# Patient Record
Sex: Male | Born: 1990 | Race: White | Hispanic: No | Marital: Married
Health system: Midwestern US, Academic
[De-identification: ages and names within clinical notes are randomized; demographics above are authoritative.]

---

## 2017-02-13 ENCOUNTER — Emergency Department
Admission: EM | Admit: 2017-02-13 | Discharge: 2017-02-14 | Disposition: A | Discharge status: Home / Self Care | Payer: BLUE CROSS/BLUE SHIELD | Attending: Emergency Medicine | Admitting: Emergency Medicine

## 2017-02-13 ENCOUNTER — Emergency Department: Payer: BLUE CROSS/BLUE SHIELD

## 2017-02-13 DIAGNOSIS — Y999 Unspecified external cause status: Secondary | ICD-10-CM | POA: Insufficient documentation

## 2017-02-13 DIAGNOSIS — Y9389 Activity, other specified: Secondary | ICD-10-CM | POA: Insufficient documentation

## 2017-02-13 DIAGNOSIS — Y929 Unspecified place or not applicable: Secondary | ICD-10-CM | POA: Insufficient documentation

## 2017-02-13 DIAGNOSIS — S39012A Strain of muscle, fascia and tendon of lower back, initial encounter: Secondary | ICD-10-CM | POA: Diagnosis not present

## 2017-02-13 DIAGNOSIS — X509XXA Other and unspecified overexertion or strenuous movements or postures, initial encounter: Secondary | ICD-10-CM | POA: Insufficient documentation

## 2017-02-13 DIAGNOSIS — M545 Low back pain: Secondary | ICD-10-CM | POA: Diagnosis present

## 2017-02-13 LAB — URINALYSIS, ROUTINE W REFLEX MICROSCOPIC
Bilirubin Urine: NEGATIVE
Glucose, UA: NEGATIVE mg/dL
HGB URINE DIPSTICK: NEGATIVE
Ketones, ur: NEGATIVE mg/dL
Leukocytes, UA: NEGATIVE
Nitrite: NEGATIVE
Protein, ur: NEGATIVE mg/dL
SPECIFIC GRAVITY, URINE: 1.018 (ref 1.005–1.030)
pH: 5 (ref 5.0–8.0)

## 2017-02-13 MED ORDER — CYCLOBENZAPRINE HCL 10 MG PO TABS
10.0000 mg | ORAL_TABLET | Freq: Once | ORAL | Status: DC
  Filled 2017-02-13: qty 1

## 2017-02-13 MED ORDER — KETOROLAC TROMETHAMINE 60 MG/2ML IM SOLN
30.0000 mg | Freq: Once | INTRAMUSCULAR | Status: AC
  Administered 2017-02-13: 30 mg via INTRAMUSCULAR
  Filled 2017-02-13: qty 2

## 2017-02-13 MED ORDER — NABUMETONE 750 MG PO TABS: 750.0000 mg | ORAL_TABLET | Freq: Two times a day (BID) | ORAL | 0 refills | Status: AC

## 2017-02-13 MED ORDER — CYCLOBENZAPRINE HCL 5 MG PO TABS: 5.0000 mg | ORAL_TABLET | Freq: Three times a day (TID) | ORAL | 0 refills | Status: AC | PRN

## 2017-02-13 NOTE — Discharge Instructions (Signed)
Your exam and labs are normal following your accident. Take the prescription meds as directed. Apply ice to reduce symptoms. Follow-up with [facility] as needed.

## 2017-02-13 NOTE — ED Triage Notes (Signed)
Patient reports he was sitting crossed legged on ground working on car tire.  He was on the last lug nut when he felt "a spasm" in his back.  Patient reports pain from lower back that radiates into testicles.

## 2017-02-14 NOTE — ED Provider Notes (Signed)
[facility] Emergency Department Provider Note ____________________________________________  Time seen: 2045  I have reviewed the triage vital signs and the nursing notes.  HISTORY  Chief Complaint  Back Pain  HPI Ricky Pearson is a 26 y.o. male Presents to the ED for evaluation of acute low back pain.Patient describes onset of low back pain after he had finished changing the tire on his car. He describes sitting on the ground with his legs crossed underneath him, attempting to turn the last Loc not, when he felt a sharp "spasm" in his back. He reports that he was unable to stand due to the sharp pain. He describes pain radius into his lower back bilaterally, and then wraps around to his testicles and groin bilaterally. He denies any dysuria, hematuria, or retention. He also denies any form swelling and scrotum or inguinal region. He denies any history of ongoing or chronic back pain. He denies any interventions prior to arrival. He presents now In by his wife, for evaluation of this acute low back pain and spasm.  No past medical history on file.  There are no active problems to display for this patient.  No past surgical history on file.  Prior to Admission medications   Medication Sig Start Date End Date Taking? Authorizing Provider  cyclobenzaprine (FLEXERIL) 5 MG tablet Take 1 tablet (5 mg total) by mouth 3 (three) times daily as needed for muscle spasms. 02/13/17   Jaselynn Tamas, Charlesetta Ivory, PA-C  nabumetone (RELAFEN) 750 MG tablet Take 1 tablet (750 mg total) by mouth 2 (two) times daily. 02/13/17   Salimata Christenson, Charlesetta Ivory, PA-C    Allergies Patient has no known allergies.  No family history on file.  Social History Social History  Substance Use Topics  . Smoking status: Not on file  . Smokeless tobacco: Not on file  . Alcohol use Not on file    Review of Systems  Constitutional: Negative for fever. Cardiovascular: Negative for chest  pain. Respiratory: Negative for shortness of breath. Gastrointestinal: Negative for abdominal pain, vomiting and diarrhea. Genitourinary: Negative for dysuria, hematuria, Or retention. Musculoskeletal: Positive for back pain. Skin: Negative for rash. Neurological: Negative for headaches, focal weakness or numbness. ____________________________________________  PHYSICAL EXAM:  VITAL SIGNS: ED Triage Vitals  Enc Vitals Group     BP 02/13/17 1910 (!) 143/54     Pulse Rate 02/13/17 1910 80     Resp 02/13/17 1910 18     Temp 02/13/17 1910 98.7 F (37.1 C)     Temp Source 02/13/17 1910 Oral     SpO2 02/13/17 1910 99 %     Weight 02/13/17 1909 195 lb (88.5 kg)     Height 02/13/17 1909 5\' 8"  (1.727 m)     Head Circumference --      Peak Flow --      Pain Score 02/13/17 1908 8     Pain Loc --      Pain Edu? --      Excl. in GC? --     Constitutional: Alert and oriented. Well appearing and in no distress. Head: Normocephalic and atraumatic. Eyes: Conjunctivae are normal. PERRL. Normal extraocular movements Cardiovascular: Normal rate, regular rhythm. Normal distal pulses. Respiratory: Normal respiratory effort. No wheezes/rales/rhonchi. Gastrointestinal: Soft and nontender. No distention. Musculoskeletal: Normal spinal alignment without midline tenderness, spasm, deformity, or step-off. Patient with some mild tenderness over the SI joints bilaterally. Normal transition from sit to stand without difficulty. Normal lumbar extension and decreased flexion  to the anterior thighs. Patient with a negative seated straight leg raise. Nontender with normal range of motion in all extremities.  Neurologic: Cranial nerves II through XII grossly intact. Normal LE DTRs bilaterally. Normal Foot eversion And toe dorsiflexion on exam.Normal speech and language. No gross focal neurologic deficits are appreciated. Skin:  Skin is warm, dry and intact. No rash  noted. ____________________________________________   LABS (pertinent positives/negatives) Labs Reviewed  URINALYSIS, ROUTINE W REFLEX MICROSCOPIC - Abnormal; Notable for the following:       Result Value   Color, Urine YELLOW (*)    APPearance CLEAR (*)    All other components within normal limits  ____________________________________________   RADIOLOGY  Lumbar Spine IMPRESSION: Negative. ____________________________________________  PROCEDURES  Toradol 30 mg IM ____________________________________________  INITIAL IMPRESSION / ASSESSMENT AND PLAN / ED COURSE  Patient with an acute lumbar sacral strain without radiologic evidence of fracture or dislocation. His exam is also benign at this time without any acute nerve neuromuscular deficit. He will be discharged with prescription for Flexeril and Relafen to dose as directed. He is advised to follow-up with [facility] for ongoing symptom management. A work note is provided for 2 shifts as requested. Return precautions are reviewed. ____________________________________________  FINAL CLINICAL IMPRESSION(S) / ED DIAGNOSES  Final diagnoses:  Strain of lumbar region, initial encounter      Lissa HoardMenshew, Danyela Posas V Bacon, PA-C 02/14/17 0026    Myrna BlazerSchaevitz, David Matthew, MD 02/14/17 [phone]

## 2018-10-18 IMAGING — CR DG LUMBAR SPINE COMPLETE 4+V
1 series · 4 of 4 positions shown · non-contrast
Comparison: None.

CLINICAL DATA: Low back pain

EXAM:
LUMBAR SPINE - COMPLETE 4+ VIEW

[Series 1: dg lumbar spine complete 4 +v · 0.14mm/px · 4 of 4 slices shown]
[im 1/4]
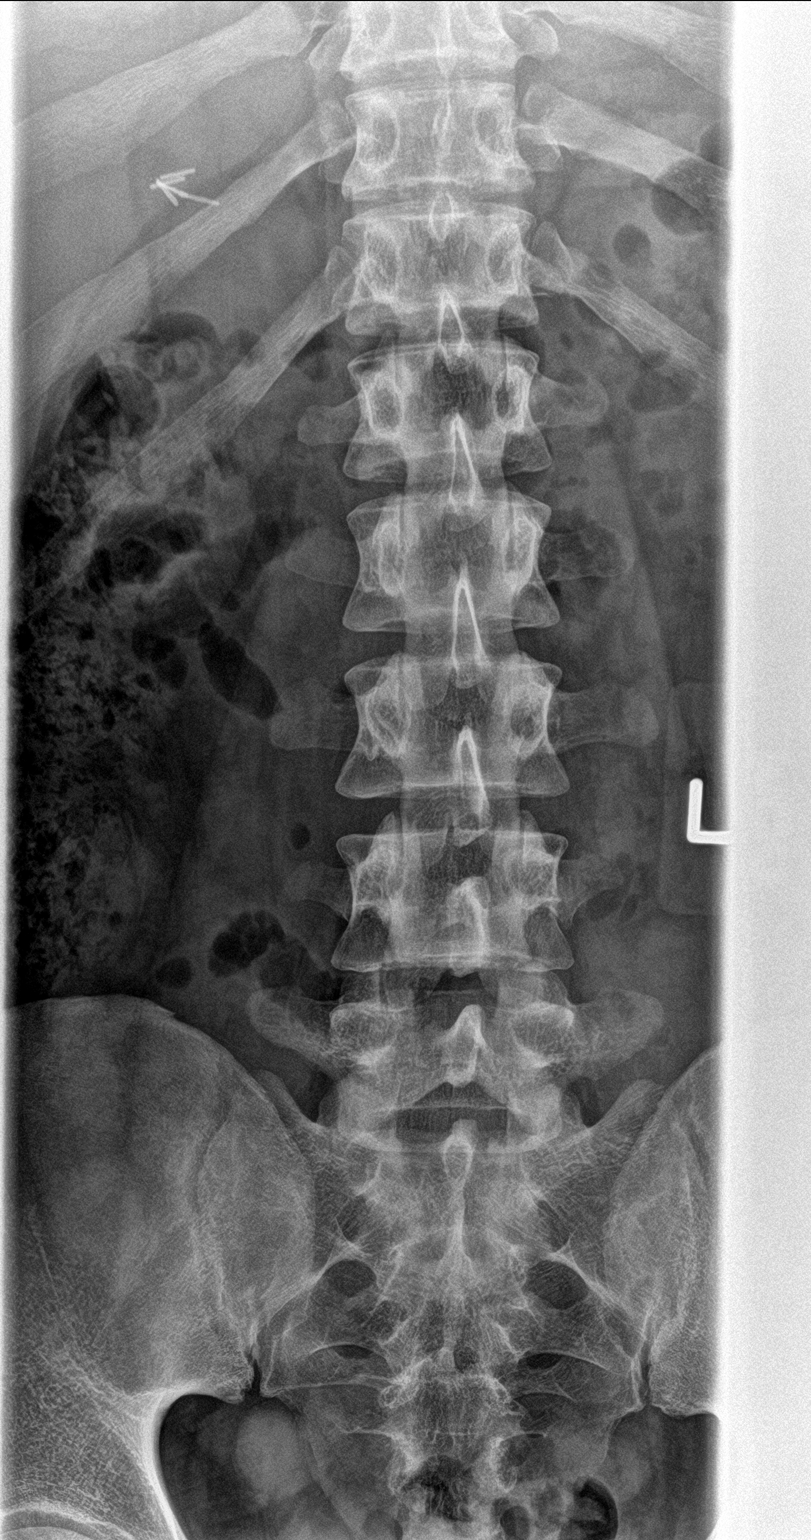
[im 2/4]
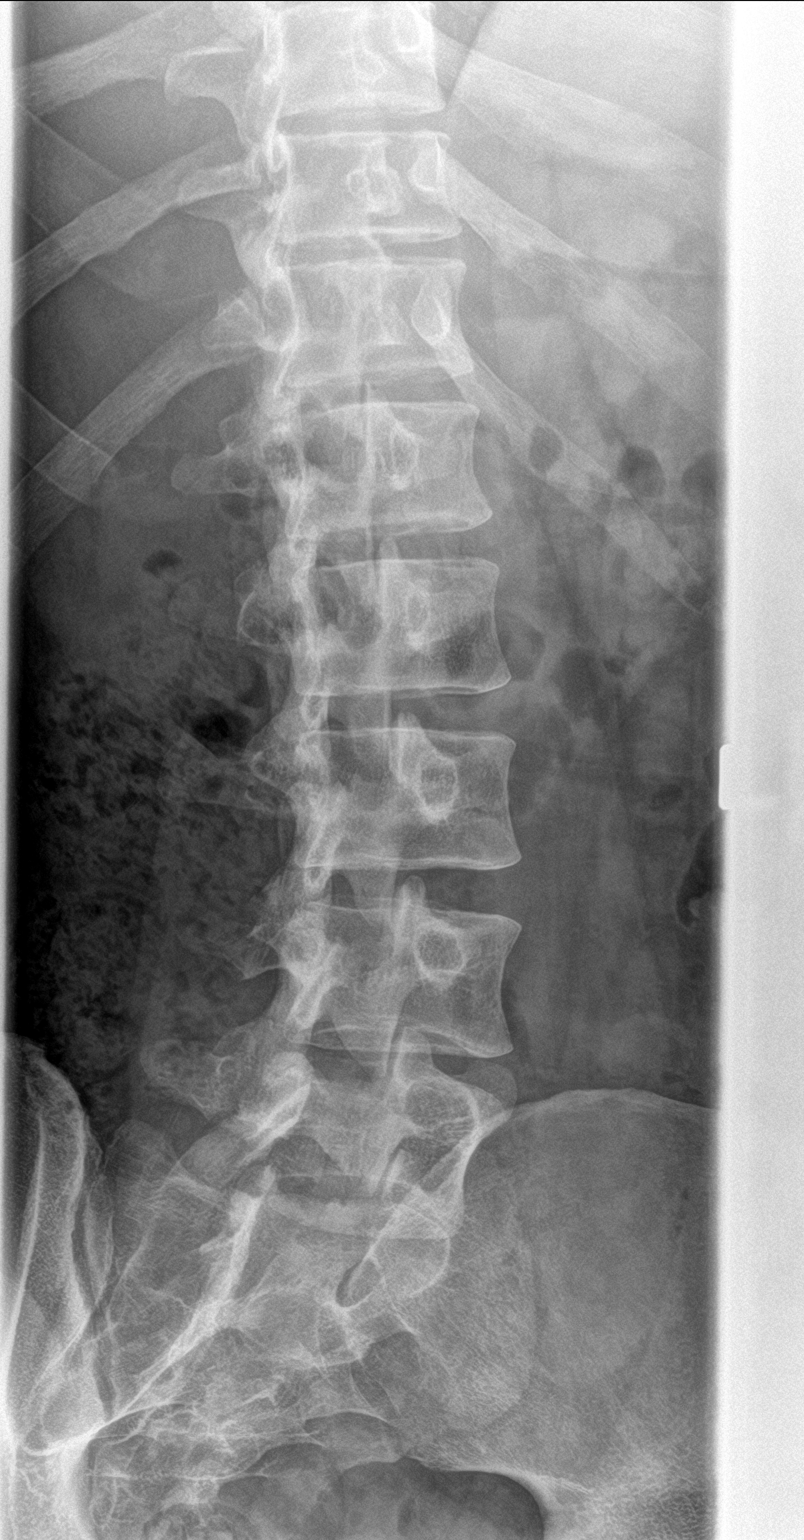
[im 3/4]
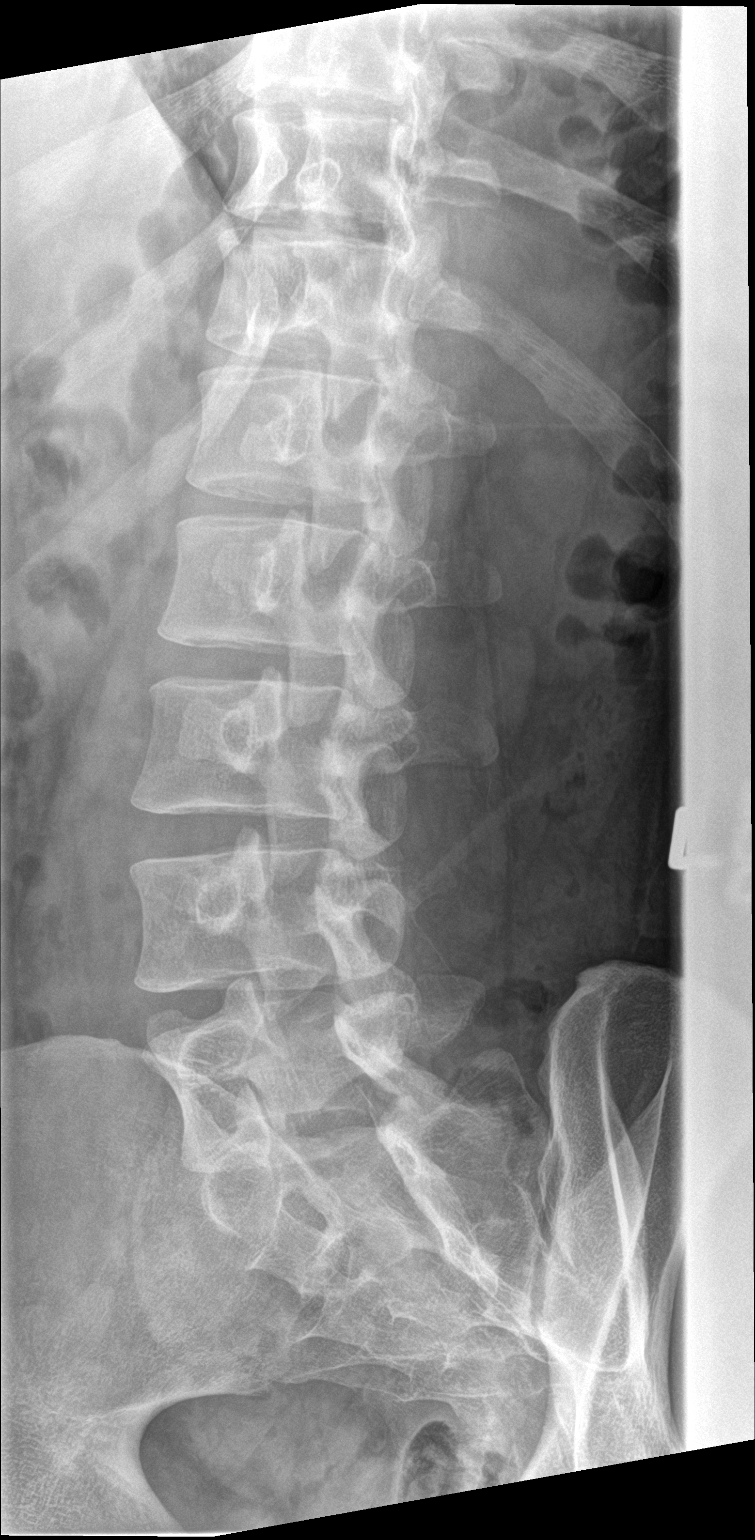
[im 4/4]
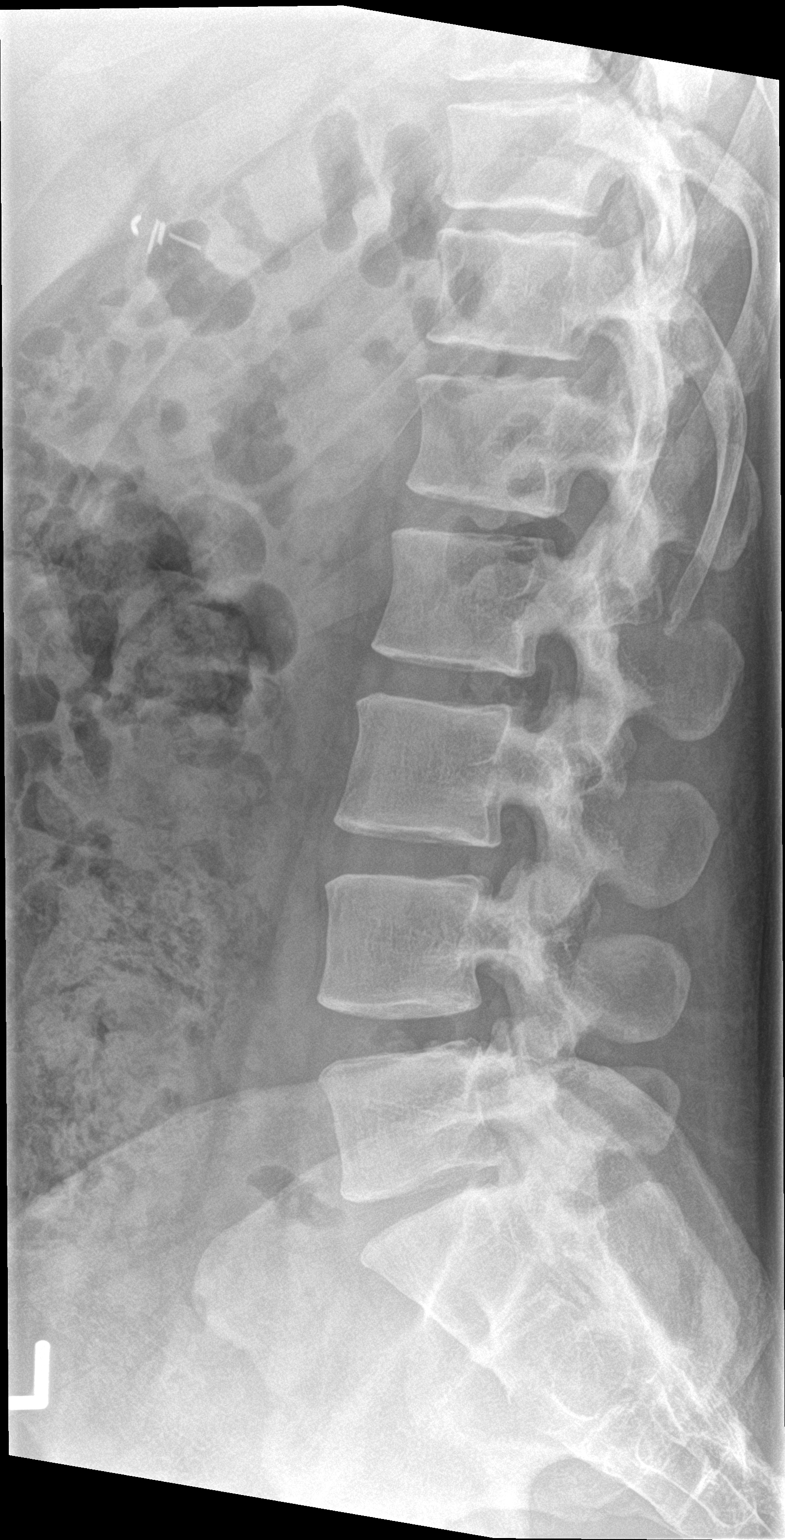

[4 of 4 positions shown; findings below may reference images not displayed]

FINDINGS: The lumbar vertebrae are normal in height. No fracture or other
acute bone abnormality is evident. There is no spondylolysis or
spondylolisthesis. There is good preservation of intervertebral disc
spaces. Sacroiliac joints appear unremarkable.
IMPRESSION: Negative.

## 2020-05-22 ENCOUNTER — Encounter: Admit: 2020-05-22 | Discharge: 2020-05-22 | Payer: BC Managed Care – PPO

## 2020-05-22 ENCOUNTER — Ambulatory Visit: Admit: 2020-05-22 | Discharge: 2020-05-22 | Payer: BC Managed Care – PPO

## 2020-05-22 DIAGNOSIS — N469 Male infertility, unspecified: Secondary | ICD-10-CM

## 2020-05-22 DIAGNOSIS — F329 Major depressive disorder, single episode, unspecified: Secondary | ICD-10-CM

## 2020-05-22 DIAGNOSIS — N390 Urinary tract infection, site not specified: Secondary | ICD-10-CM

## 2020-05-28 ENCOUNTER — Encounter: Admit: 2020-05-28 | Discharge: 2020-05-28 | Payer: BC Managed Care – PPO

## 2020-06-05 ENCOUNTER — Encounter: Admit: 2020-06-05 | Discharge: 2020-06-05 | Payer: BC Managed Care – PPO

## 2020-06-05 ENCOUNTER — Ambulatory Visit: Admit: 2020-06-05 | Discharge: 2020-06-06 | Payer: BC Managed Care – PPO

## 2020-06-05 DIAGNOSIS — R1033 Periumbilical pain: Principal | ICD-10-CM

## 2020-06-05 DIAGNOSIS — F329 Major depressive disorder, single episode, unspecified: Secondary | ICD-10-CM

## 2020-06-05 DIAGNOSIS — N390 Urinary tract infection, site not specified: Secondary | ICD-10-CM

## 2020-06-12 ENCOUNTER — Encounter: Admit: 2020-06-12 | Discharge: 2020-06-12 | Payer: BC Managed Care – PPO

## 2020-06-24 ENCOUNTER — Encounter: Admit: 2020-06-24 | Discharge: 2020-06-24 | Payer: BC Managed Care – PPO

## 2020-06-24 NOTE — Telephone Encounter
Contacted patient to let him know that Dr. Shon Hale recommended that he wait until after his wedding to proceed with his procedure. Patient agreed and said he'd call after his wedding to get rescheduled.

## 2022-03-13 ENCOUNTER — Inpatient Hospital Stay: Admit: 2022-03-13 | Payer: BC Managed Care – PPO

## 2022-03-13 ENCOUNTER — Encounter: Admit: 2022-03-13 | Discharge: 2022-03-13 | Payer: BC Managed Care – PPO

## 2022-03-13 ENCOUNTER — Emergency Department: Admit: 2022-03-13 | Discharge: 2022-03-13 | Payer: BC Managed Care – PPO

## 2022-03-13 DIAGNOSIS — K5792 Diverticulitis of intestine, part unspecified, without perforation or abscess without bleeding: Secondary | ICD-10-CM

## 2022-03-13 DIAGNOSIS — R1084 Generalized abdominal pain: Secondary | ICD-10-CM

## 2022-03-13 DIAGNOSIS — K5740 Diverticulitis of both small and large intestine with perforation and abscess without bleeding: Secondary | ICD-10-CM

## 2022-03-13 LAB — URINALYSIS MICROSCOPIC REFLEX TO CULTURE

## 2022-03-13 LAB — CHLAM/NG PCR URINE
CHLAMYDIA TRACH PCR: NEGATIVE
NEISSERIA GONORROEAE PCR: NEGATIVE

## 2022-03-13 LAB — COMPREHENSIVE METABOLIC PANEL
EGFR: >60 mL/min — ABNORMAL LOW (ref >60)
SODIUM: 136 MMOL/L — ABNORMAL LOW (ref 137–147)

## 2022-03-13 LAB — MAGNESIUM: MAGNESIUM: 2 mg/dL (ref 1.6–2.6)

## 2022-03-13 LAB — CBC AND DIFF
ABSOLUTE BASO COUNT: 0 K/UL (ref 0–0.20)
ABSOLUTE EOS COUNT: 0 K/UL (ref 0–0.45)
ABSOLUTE MONO COUNT: 0.3 K/UL (ref 0–0.80)
MDW (MONOCYTE DISTRIBUTION WIDTH): 26 — ABNORMAL HIGH (ref <20.7)
WBC COUNT: 9.4 K/UL (ref 4.5–11.0)

## 2022-03-13 LAB — POC LACTATE: LACTIC ACID POC: 2.7 MMOL/L — ABNORMAL HIGH (ref 0.5–2.0)

## 2022-03-13 LAB — COVID-19 (SARS-COV-2) PCR

## 2022-03-13 LAB — URINALYSIS DIPSTICK REFLEX TO CULTURE
LEUKOCYTES: NEGATIVE U/L (ref 7–56)
NITRITE: NEGATIVE MMOL/L — ABNORMAL HIGH (ref 21–30)
URINE ASCORBIC ACID, UA: NEGATIVE K/UL — ABNORMAL HIGH (ref 3–12)
URINE BILE: NEGATIVE g/dL — ABNORMAL HIGH (ref 3.5–5.0)
URINE BLOOD: NEGATIVE U/L — ABNORMAL HIGH (ref 25–110)
URINE KETONE: NEGATIVE mg/dL — ABNORMAL HIGH (ref 0.3–1.2)

## 2022-03-13 MED ORDER — LACTATED RINGERS IV SOLP
INTRAVENOUS | 0 refills | Status: AC
Start: 2022-03-13 — End: ?
  Administered 2022-03-13 – 2022-03-15 (×4): 1000.000 mL via INTRAVENOUS

## 2022-03-13 MED ORDER — ONDANSETRON HCL (PF) 4 MG/2 ML IJ SOLN
4 mg | Freq: Once | INTRAVENOUS | 0 refills | Status: CP
Start: 2022-03-13 — End: ?
  Administered 2022-03-13: 13:00:00 4 mg via INTRAVENOUS

## 2022-03-13 MED ORDER — ACETAMINOPHEN 1,000 MG/100 ML (10 MG/ML) IV SOLN
1000 mg | Freq: Once | INTRAVENOUS | 0 refills | Status: CP
Start: 2022-03-13 — End: ?
  Administered 2022-03-13: 22:00:00 1000 mg via INTRAVENOUS

## 2022-03-13 MED ORDER — ENOXAPARIN 40 MG/0.4 ML SC SYRG
40 mg | Freq: Every day | SUBCUTANEOUS | 0 refills | Status: AC
Start: 2022-03-13 — End: ?
  Administered 2022-03-15 – 2022-03-18 (×4): 40 mg via SUBCUTANEOUS

## 2022-03-13 MED ORDER — FENTANYL CITRATE (PF) 50 MCG/ML IJ SOLN
50 ug | INTRAVENOUS | 0 refills | Status: CP | PRN
Start: 2022-03-13 — End: ?
  Administered 2022-03-13 – 2022-03-14 (×3): 50 ug via INTRAVENOUS

## 2022-03-13 MED ORDER — ONDANSETRON HCL (PF) 4 MG/2 ML IJ SOLN
4 mg | INTRAVENOUS | 0 refills | Status: AC | PRN
Start: 2022-03-13 — End: ?
  Administered 2022-03-14 – 2022-03-15 (×3): 4 mg via INTRAVENOUS

## 2022-03-13 MED ORDER — LACTATED RINGERS IV SOLP
1000 mL | INTRAVENOUS | 0 refills | Status: CP
Start: 2022-03-13 — End: ?
  Administered 2022-03-13: 13:00:00 1000 mL via INTRAVENOUS

## 2022-03-13 MED ORDER — FENTANYL CITRATE (PF) 50 MCG/ML IJ SOLN
25-50 ug | INTRAVENOUS | 0 refills | Status: AC | PRN
Start: 2022-03-13 — End: ?
  Administered 2022-03-13 – 2022-03-14 (×11): 50 ug via INTRAVENOUS

## 2022-03-13 MED ORDER — LORAZEPAM 2 MG/ML IJ SOLN
1 mg | Freq: Once | INTRAVENOUS | 0 refills | Status: AC
Start: 2022-03-13 — End: ?

## 2022-03-13 MED ORDER — IOHEXOL 350 MG IODINE/ML IV SOLN
100 mL | Freq: Once | INTRAVENOUS | 0 refills | Status: CP
Start: 2022-03-13 — End: ?
  Administered 2022-03-13: 14:00:00 100 mL via INTRAVENOUS

## 2022-03-13 MED ORDER — ONDANSETRON 4 MG PO TBDI
4 mg | ORAL | 0 refills | Status: AC | PRN
Start: 2022-03-13 — End: ?
  Administered 2022-03-17: 4 mg via ORAL

## 2022-03-13 MED ORDER — NICOTINE 7 MG/24 HR TD PT24
1 | Freq: Every day | TRANSDERMAL | 0 refills | Status: AC
Start: 2022-03-13 — End: ?
  Administered 2022-03-13 – 2022-03-17 (×5): 1 via TRANSDERMAL

## 2022-03-13 MED ORDER — SODIUM CHLORIDE 0.9 % IJ SOLN
50 mL | Freq: Once | INTRAVENOUS | 0 refills | Status: CP
Start: 2022-03-13 — End: ?
  Administered 2022-03-13: 14:00:00 50 mL via INTRAVENOUS

## 2022-03-13 MED ORDER — PIPERACILLIN/TAZOBACTAM 4.5 G/100ML NS IVPB (MB+)
4.5 g | Freq: Once | INTRAVENOUS | 0 refills | Status: CP
Start: 2022-03-13 — End: ?
  Administered 2022-03-13 (×2): 4.5 g via INTRAVENOUS

## 2022-03-13 MED ORDER — LACTATED RINGERS IV SOLP
500 mL | INTRAVENOUS | 0 refills | Status: DC
Start: 2022-03-13 — End: 2022-03-13

## 2022-03-13 NOTE — ED Notes
31 y.o. male presents to the ED w/ c/o abdominal pain. Pt reports lower abdominal pain radiating to testicles along w/ nausea w/ out emesis, fever of 102F 2 days ago, intermittent lower back pain, and pain w/ urination. Pt reports symptoms were improving up until this morning ~0530 when he woke up w/ sharp lower abd. pain. Pt reports taking M.O.M 2 days ago for constipation and has had diarrhea w/out blood since. Pt denies any CP, SOA, HA, light-headedness, or dizziness. Pt A&Ox4 resting in bed, respirations even and non-labored. Skin is warm, dry, intact and appropriate for ethnicity. Pt hooked up to monitors. VSS. Bed is in lowest locked position, call light within reach. Belongings remain w/ pt.  Medical History:   Diagnosis Date    Depression     Urinary tract infection

## 2022-03-13 NOTE — ED Provider Notes
Rodney Padilla is a 31 y.o. male.    Chief Complaint:  Chief Complaint   Patient presents with   ? Abdominal pain     Lower quadrant abd pain x4 days with intermittent fever. Was constipated, but treated at home with milk of mag       History of Present Illness:  Rodney Padilla is a 31 y.o. male, who presents to the emergency department for abdominal pain. Patient endorses a four day history of suprapubic abdominal pain that radiates down to his testicles and through to his lower back. At first, he thought he was constipated so he took milk of magnesia two days ago with immediate relief of constipation. He has had watery diarrhea since. He also endorses associated dysuria, dark urine, 102 F fevers, but denies any hematuria, blood in stool, nausea, vomiting, or testicular swelling. Past abdominal surgeries include a cholecystectomy. Otherwise, the patient has no other acute medical concerns at this time.       History provided by:  Patient  Language interpreter used: No    Abdominal pain  Associated symptoms: dysuria and fever    Associated symptoms: no chest pain, no hematuria, no nausea, no shortness of breath, no sore throat and no vomiting        Review of Systems:  Review of Systems   Constitutional: Positive for fever.   HENT: Negative for sore throat.    Eyes: Negative for visual disturbance.   Respiratory: Negative for shortness of breath.    Cardiovascular: Negative for chest pain.   Gastrointestinal: Positive for abdominal pain. Negative for blood in stool, nausea and vomiting.   Genitourinary: Positive for dysuria and testicular pain. Negative for hematuria and scrotal swelling.   Musculoskeletal: Negative for back pain.   Skin: Negative for rash.   Neurological: Negative for headaches.       Allergies:  Patient has no known allergies.    Past Medical History:  Medical History:   Diagnosis Date   ? Depression    ? Urinary tract infection        Past Surgical History:  Surgical History:   Procedure Laterality Date   ? BROKEN BONES/ FRACTURES Left     broken leg   ? BROKEN BONES/ FRACTURES Left     hand   ? GALLBLADDER SURGERY     ? HX CHOLECYSTECTOMY         Pertinent medical/surgical history reviewed  Medical History:   Diagnosis Date   ? Depression    ? Urinary tract infection      Surgical History:   Procedure Laterality Date   ? BROKEN BONES/ FRACTURES Left     broken leg   ? BROKEN BONES/ FRACTURES Left     hand   ? GALLBLADDER SURGERY     ? HX CHOLECYSTECTOMY         Social History:  Social History     Tobacco Use   ? Smoking status: Every Day     Packs/day: 0.50     Types: Cigarettes   ? Smokeless tobacco: Never   Substance Use Topics   ? Alcohol use: Yes     Alcohol/week: 3.0 standard drinks of alcohol     Types: 3 Cans of beer per week   ? Drug use: Yes     Types: Marijuana     Social History     Substance and Sexual Activity   Drug Use Yes   ? Types: Marijuana  Family History:  Family History   Problem Relation Age of Onset   ? Hernia Mother    ? Pancreatitis Mother    ? Heart Attack Father    ? Diabetes Father        Vitals:  ED Vitals    Date and Time T BP P RR SPO2P SPO2 User   03/13/22 1230 -- 111/76 -- -- 100 93 % MB   03/13/22 1100 -- 120/75 -- -- 99 96 % MB   03/13/22 0930 -- 119/73 -- -- -- -- MB   03/13/22 0800 -- 124/69 -- -- 118 98 % MB   03/13/22 0626 37.8 ?C (100 ?F) 134/79 100 18 PER MINUTE -- 100 % CC          Physical Exam:  Physical Exam  Vitals and nursing note reviewed.   Constitutional:       Appearance: Normal appearance.   HENT:      Head: Normocephalic and atraumatic.   Eyes:      Extraocular Movements: Extraocular movements intact.      Conjunctiva/sclera: Conjunctivae normal.   Cardiovascular:      Rate and Rhythm: Regular rhythm. Tachycardia present.      Pulses: Normal pulses.      Heart sounds: Normal heart sounds.   Pulmonary:      Effort: Pulmonary effort is normal.      Breath sounds: Normal breath sounds.   Abdominal:      General: Bowel sounds are normal. There is no distension.      Palpations: Abdomen is soft.      Tenderness: There is abdominal tenderness in the suprapubic area.   Genitourinary:     Testes: Normal.      Comments: No masses, swelling, redness, tenderness.  Musculoskeletal:         General: Normal range of motion.      Cervical back: Neck supple.   Skin:     General: Skin is warm and dry.   Neurological:      Mental Status: He is oriented to person, place, and time. Mental status is at baseline.   Psychiatric:         Mood and Affect: Mood normal.         Behavior: Behavior normal.         Laboratory Results:  Labs Reviewed   CBC AND DIFF - Abnormal       Result Value Ref Range Status    White Blood Cells 9.4  4.5 - 11.0 K/UL Final    RBC 4.80  4.4 - 5.5 M/UL Final    Hemoglobin 15.4  13.5 - 16.5 GM/DL Final    Hematocrit 29.5  40 - 50 % Final    MCV 93.7  80 - 100 FL Final    MCH 32.1  26 - 34 PG Final    MCHC 34.2  32.0 - 36.0 G/DL Final    RDW 62.1  11 - 15 % Final    Platelet Count 186  150 - 400 K/UL Final    MPV 7.9  7 - 11 FL Final    Neutrophils 89 (*) 41 - 77 % Final    Lymphocytes 8 (*) 24 - 44 % Final    Monocytes 3 (*) 4 - 12 % Final    Eosinophils 0  0 - 5 % Final    Basophils 0  0 - 2 % Final    Absolute Neutrophil Count 8.35 (*) 1.8 -  7.0 K/UL Final    Absolute Lymph Count 0.70 (*) 1.0 - 4.8 K/UL Final    Absolute Monocyte Count 0.31  0 - 0.80 K/UL Final    Absolute Eosinophil Count 0.02  0 - 0.45 K/UL Final    Absolute Basophil Count 0.03  0 - 0.20 K/UL Final    MDW (Monocyte Distribution Width) 26.0 (*) <20.7 Final   COMPREHENSIVE METABOLIC PANEL - Abnormal    Sodium 136 (*) 137 - 147 MMOL/L Final    Potassium 3.8  3.5 - 5.1 MMOL/L Final    Chloride 99  98 - 110 MMOL/L Final    Glucose 97  70 - 100 MG/DL Final    Blood Urea Nitrogen 11  7 - 25 MG/DL Final    Creatinine 1.61  0.4 - 1.24 MG/DL Final    Calcium 9.7  8.5 - 10.6 MG/DL Final    Total Protein 7.8  6.0 - 8.0 G/DL Final    Total Bilirubin 2.1 (*) 0.3 - 1.2 MG/DL Final    Albumin 4.2  3.5 - 5.0 G/DL Final    Alk Phosphatase 111 (*) 25 - 110 U/L Final    AST (SGOT) 15  7 - 40 U/L Final    CO2 25  21 - 30 MMOL/L Final    ALT (SGPT) 22  7 - 56 U/L Final    Anion Gap 12  3 - 12 Final    eGFR >60  >60 mL/min Final   URINALYSIS DIPSTICK REFLEX TO CULTURE - Abnormal    Color,UA AMBER   Final    Turbidity,UA CLEAR  CLEAR-CLEAR Final    Specific Gravity-Urine >1.050 (*) 1.005 - 1.030 Final    pH,UA 5.0  5.0 - 8.0 Final    Protein,UA 2+ (*) NEG-NEG Final    Glucose,UA NEG  NEG-NEG Final    Ketones,UA NEG  NEG-NEG Final    Bilirubin,UA NEG  NEG-NEG Final    Blood,UA NEG  NEG-NEG Final    Urobilinogen,UA INCREASED (*) NORM-NORMAL Final    Nitrite,UA NEG  NEG-NEG Final    Leukocytes,UA NEG  NEG-NEG Final    Urine Ascorbic Acid, UA NEG  NEG-NEG Final   POC LACTATE - Abnormal    LACTIC ACID POC 2.7 (*) 0.5 - 2.0 MMOL/L Final   COVID-19 (SARS-COV-2) PCR    COVID-19 (SARS-CoV-2) PCR Source     Corrected    Value: FLOCKED SWAB  NASOPHARYNGEAL      COVID-19 (SARS-CoV-2) PCR NOT DETECTED  DN-NOT DETECTED Final   MAGNESIUM    Magnesium 2.0  1.6 - 2.6 mg/dL Final   URINALYSIS MICROSCOPIC REFLEX TO CULTURE    WBCs,UA 0-2  0 - 2 /HPF Final    RBCs,UA 0-2  0 - 3 /HPF Final    Comment,UA     Final    Value: Criteria for reflex to culture are WBC>10, Positive Nitrite, and/or >=+1   leukocytes. If quantity is not sufficient, an addendum will follow.      MucousUA 3+   Final    Squamous Epithelial Cells 0-2  0 - 5 Final   CHLAM/NG PCR URINE    Chlamydia Trachomatis Probe NEG  NEG-NEG Final    Neisseria Gonorroeae PCR NEG  NEG-NEG Final   UA GREY TOP TUBE   CLEAR TOP EXTRA URINE TUBE   POC LACTATE          Radiology Interpretation:    CT ABD/PELV W CONTRAST   Final Result  1.  Acute complicated sigmoid colon diverticulitis with associated perforation and peritonitis.    2.  Mild mural thickening of adjacent loops of small bowel in the lower abdomen, likely reactive enteritis.   3.  Mildly distended loops of small bowel, likely ileus.   4.  Trace ascites.         Janene Harvey, MD discussed these findings with Dr. Jean Rosenthal by telephone 03/13/2022 9:15 AM.          Approved by Clide Dales, MD on 03/13/2022 9:15 AM      By my electronic signature, I attest that I have personally reviewed the images for this examination and formulated the interpretations and opinions expressed in this report          Finalized by Rosilyn Mings, M.D. on 03/13/2022 9:36 AM. Dictated by Clide Dales, MD on 03/13/2022 8:41 AM.             EKG:      Medical Decision Making:    ED Course as of 03/13/22 2044   Fri Mar 13, 2022   [id number] Rodney Padilla is a 31 y.o. male with history as above presenting for complaint of abdominal pain, diarrhea, fever. Available records were reviewed. Vital signs and physical exam as documented above.     Differential diagnosis at this time includes but is not limited to appendicitis, nephrolithiasis, cystitis.     Laboratory evaluation with somewhat elevated total bilirubin of unknown etiology, CBC without marked leukocytosis, no elevated MDW.  Urinalysis noninfectious.  Patient did have slight lactic acidosis at 2.7.  He was given IV fluids, Zofran, fentanyl for pain.  A CT of the abdomen was conducted with finding of concern for perforated/complicated diverticulitis.  Given this, Zosyn was started, emergency general surgery was consulted for further recommendations.    Patient to be admitted to the emergency general surgery service, n.p.o., with no surgical intervention at this time.    Findings discussed with patient and the plan for admission. Patient understands and agrees with plan. Patient reassessed with appropriate vital signs and symptom control for transfer. Patient verbalized understanding. Admission team was notified and patient was accepted to inpatient service. All questions were answered prior to transfer to inpatient service. [JB]      ED Course User Index  [JB] Shea Evans, MD Complexity of Problems Addressed  Patient's active diagnoses as well as contributing pre-existing medical problems include:  Clinical Impression   Diverticulitis of both large and small intestine with perforation, unspecified bleeding status   Generalized abdominal pain     Evaluation performed for potential threat to life or bodily function during this visit given the initial differential diagnosis and clinical impression(s) as discussed previously in MDM/ED course.    Additional data reviewed:    ? History was obtained from an independent historian: Family  ? Prior non-ED notes reviewed: Not in addition to what is mentioned above  ? Independent interpretation of diagnostic tests was performed by me: CT: Marked stranding in the lower abdomen  ? Patient presentation/management was discussed with the following qualified health care professionals and/or other relevant professionals: Surgeon on Duty    Risk evaluation:    ? Diagnosis or treatment of patient condition impacted by social determinant of health: None  ? Tests Considered but not performed due to clinical scoring (if not mentioned in ED course, aside from what is implied by clinical scores listed):   ? Rationale regarding whether admission or escalation of care  considered if not performed (if not mentioned in ED course, aside from what is implied by clinical scores listed):       Facility Administered Meds:  Medications   fentaNYL citrate PF (SUBLIMAZE) injection 50 mcg (50 mcg Intravenous Given 03/13/22 1022)   ondansetron (ZOFRAN ODT) rapid dissolve tablet 4 mg (has no administration in time range)     Or   ondansetron (ZOFRAN) injection 4 mg (has no administration in time range)   enoxaparin (LOVENOX) syringe 40 mg (has no administration in time range)   lactated ringers infusion ( Intravenous Given - New Bag 03/13/22 1206)   fentaNYL citrate PF (SUBLIMAZE) injection 25-50 mcg (50 mcg Intravenous Given 03/13/22 1200)   LORazepam (ATIVAN) injection 1 mg (has no administration in time range)   lactated ringers infusion (0 mL Intravenous Infusion Stopped 03/13/22 1020)   ondansetron (ZOFRAN) injection 4 mg (4 mg Intravenous Given 03/13/22 0759)   iohexoL (OMNIPAQUE-350) 350 mg/mL injection 100 mL (100 mL Intravenous Given 03/13/22 0840)   sodium chloride PF 0.9% injection 50 mL (50 mL Intravenous Given 03/13/22 0839)   piperacillin/tazobactam (ZOSYN) 4.5 g in sodium chloride 0.9% (NS) 100 mL IVPB (MB+) (0 g Intravenous Infusion Stopped 03/13/22 1053)       Clinical Impression:  Clinical Impression   Diverticulitis of both large and small intestine with perforation, unspecified bleeding status   Generalized abdominal pain       Disposition/Follow up  ED Disposition     ED Disposition   Admit           No follow-up provider specified.    Medications:  Current Discharge Medication List          Procedure Notes:  Procedures       Attestation / Supervision:  Reginold Agent, am scribing for and in the presence of Stefani Dama, MD.    Dolores Hoose    I, Stefani Dama, MD, personally performed the services described in this documentation as scribed and it is both accurate and complete.    Stefani Dama, MD  Emergency Medicine, PGY-3  Reach[facility] by [facility]  03/13/2022

## 2022-03-14 MED ADMIN — SODIUM CHLORIDE 0.9 % IV PGBK (MB+) [95161]: 4.5 g | INTRAVENOUS | @ 19:00:00 | NDC 00338915930

## 2022-03-14 MED ADMIN — FENTANYL CITRATE (PF) 50 MCG/ML IJ SOLN [3037]: 50 ug | INTRAVENOUS | NDC 00409909412

## 2022-03-14 MED ADMIN — PIPERACILLIN-TAZOBACTAM 4.5 GRAM IV SOLR [80419]: 4.5 g | INTRAVENOUS | @ 19:00:00 | NDC 60505615900

## 2022-03-14 MED ADMIN — ACETAMINOPHEN 1,000 MG/100 ML (10 MG/ML) IV SOLN [305632]: 1000 mg | INTRAVENOUS | @ 10:00:00 | NDC 63323043441

## 2022-03-14 MED ADMIN — FENTANYL CITRATE (PF) 50 MCG/ML IJ SOLN [3037]: 50 ug | INTRAVENOUS | @ 15:00:00 | NDC 00409909412

## 2022-03-14 MED ADMIN — FENTANYL CITRATE (PF) 50 MCG/ML IJ SOLN [3037]: 50 ug | INTRAVENOUS | @ 17:00:00 | NDC 00409909412

## 2022-03-14 MED ADMIN — FENTANYL CITRATE (PF) 50 MCG/ML IJ SOLN [3037]: 50 ug | INTRAVENOUS | @ 19:00:00 | NDC 00409909412

## 2022-03-14 MED ADMIN — FENTANYL CITRATE (PF) 50 MCG/ML IJ SOLN [3037]: 50 ug | INTRAVENOUS | @ 22:00:00 | NDC 00409909412

## 2022-03-14 MED ADMIN — ACETAMINOPHEN 1,000 MG/100 ML (10 MG/ML) IV SOLN [305632]: 1000 mg | INTRAVENOUS | @ 19:00:00 | NDC 63323043441

## 2022-03-15 MED ADMIN — FENTANYL CITRATE (PF) 50 MCG/ML IJ SOLN [3037]: 50 ug | INTRAVENOUS | @ 21:00:00 | NDC 00409909412

## 2022-03-15 MED ADMIN — FENTANYL CITRATE (PF) 50 MCG/ML IJ SOLN [3037]: 50 ug | INTRAVENOUS | @ 19:00:00 | NDC 00409909412

## 2022-03-15 MED ADMIN — SODIUM CHLORIDE 0.9 % IV PGBK (MB+) [95161]: 4.5 g | INTRAVENOUS | @ 02:00:00 | NDC 00338915930

## 2022-03-15 MED ADMIN — SODIUM CHLORIDE 0.9 % IV PGBK (MB+) [95161]: 4.5 g | INTRAVENOUS | @ 15:00:00 | NDC 00338915930

## 2022-03-15 MED ADMIN — FENTANYL CITRATE (PF) 50 MCG/ML IJ SOLN [3037]: 50 ug | INTRAVENOUS | @ 15:00:00 | NDC 00409909412

## 2022-03-15 MED ADMIN — FENTANYL CITRATE (PF) 50 MCG/ML IJ SOLN [3037]: 50 ug | INTRAVENOUS | @ 12:00:00 | NDC 00409909412

## 2022-03-15 MED ADMIN — FENTANYL CITRATE (PF) 50 MCG/ML IJ SOLN [3037]: 50 ug | INTRAVENOUS | @ 02:00:00 | NDC 00409909412

## 2022-03-15 MED ADMIN — FENTANYL CITRATE (PF) 50 MCG/ML IJ SOLN [3037]: 50 ug | INTRAVENOUS | NDC 00409909412

## 2022-03-15 MED ADMIN — PIPERACILLIN-TAZOBACTAM 4.5 GRAM IV SOLR [80419]: 4.5 g | INTRAVENOUS | @ 20:00:00 | NDC 60505615900

## 2022-03-15 MED ADMIN — PIPERACILLIN-TAZOBACTAM 4.5 GRAM IV SOLR [80419]: 4.5 g | INTRAVENOUS | @ 02:00:00 | NDC 60505615900

## 2022-03-15 MED ADMIN — ACETAMINOPHEN 1,000 MG/100 ML (10 MG/ML) IV SOLN [305632]: 1000 mg | INTRAVENOUS | @ 02:00:00 | NDC 63323043441

## 2022-03-15 MED ADMIN — FENTANYL CITRATE (PF) 50 MCG/ML IJ SOLN [3037]: 50 ug | INTRAVENOUS | @ 04:00:00 | NDC 00409909412

## 2022-03-15 MED ADMIN — FENTANYL CITRATE (PF) 50 MCG/ML IJ SOLN [3037]: 50 ug | INTRAVENOUS | @ 10:00:00 | NDC 00409909412

## 2022-03-15 MED ADMIN — FENTANYL CITRATE (PF) 50 MCG/ML IJ SOLN [3037]: 50 ug | INTRAVENOUS | @ 07:00:00 | NDC 00409909412

## 2022-03-15 MED ADMIN — PIPERACILLIN-TAZOBACTAM 4.5 GRAM IV SOLR [80419]: 4.5 g | INTRAVENOUS | @ 08:00:00 | NDC 60505615900

## 2022-03-15 MED ADMIN — PIPERACILLIN-TAZOBACTAM 4.5 GRAM IV SOLR [80419]: 4.5 g | INTRAVENOUS | @ 15:00:00 | NDC 60505615900

## 2022-03-15 MED ADMIN — MAGNESIUM SULFATE IN WATER 4 GRAM/50 ML (8 %) IV PGBK [166563]: 4 g | INTRAVENOUS | @ 15:00:00 | Stop: 2022-03-15 | NDC 63323010701

## 2022-03-15 MED ADMIN — SODIUM CHLORIDE 0.9 % IV PGBK (MB+) [95161]: 4.5 g | INTRAVENOUS | @ 08:00:00 | NDC 00338915930

## 2022-03-15 MED ADMIN — ACETAMINOPHEN 1,000 MG/100 ML (10 MG/ML) IV SOLN [305632]: 1000 mg | INTRAVENOUS | @ 19:00:00 | NDC 63323043441

## 2022-03-15 MED ADMIN — ACETAMINOPHEN 1,000 MG/100 ML (10 MG/ML) IV SOLN [305632]: 1000 mg | INTRAVENOUS | @ 11:00:00 | NDC 63323043441

## 2022-03-15 MED ADMIN — SODIUM CHLORIDE 0.9 % IV PGBK (MB+) [95161]: 4.5 g | INTRAVENOUS | @ 20:00:00 | NDC 00338915930

## 2022-03-15 MED ADMIN — FENTANYL CITRATE (PF) 50 MCG/ML IJ SOLN [3037]: 50 ug | INTRAVENOUS | @ 17:00:00 | NDC 00409909412

## 2022-03-16 MED ADMIN — FENTANYL CITRATE (PF) 50 MCG/ML IJ SOLN [3037]: 50 ug | INTRAVENOUS | @ 17:00:00 | Stop: 2022-03-16 | NDC 00409909412

## 2022-03-16 MED ADMIN — FENTANYL CITRATE (PF) 50 MCG/ML IJ SOLN [3037]: 50 ug | INTRAVENOUS | @ 02:00:00 | NDC 00409909412

## 2022-03-16 MED ADMIN — PIPERACILLIN-TAZOBACTAM 4.5 GRAM IV SOLR [80419]: 4.5 g | INTRAVENOUS | @ 02:00:00 | NDC 60505615900

## 2022-03-16 MED ADMIN — FENTANYL CITRATE (PF) 50 MCG/ML IJ SOLN [3037]: 50 ug | INTRAVENOUS | @ 05:00:00 | NDC 00409909412

## 2022-03-16 MED ADMIN — SODIUM CHLORIDE 0.9 % IV PGBK (MB+) [95161]: 4.5 g | INTRAVENOUS | @ 08:00:00 | NDC 00338915930

## 2022-03-16 MED ADMIN — SODIUM CHLORIDE 0.9 % IV PGBK (MB+) [95161]: 4.5 g | INTRAVENOUS | @ 13:00:00 | NDC 00338915930

## 2022-03-16 MED ADMIN — FENTANYL CITRATE (PF) 50 MCG/ML IJ SOLN [3037]: 50 ug | INTRAVENOUS | @ 19:00:00 | Stop: 2022-03-16 | NDC 00409909412

## 2022-03-16 MED ADMIN — ACETAMINOPHEN 1,000 MG/100 ML (10 MG/ML) IV SOLN [305632]: 1000 mg | INTRAVENOUS | @ 04:00:00 | NDC 67457094010

## 2022-03-16 MED ADMIN — FENTANYL CITRATE (PF) 50 MCG/ML IJ SOLN [3037]: 50 ug | INTRAVENOUS | @ 21:00:00 | Stop: 2022-03-16 | NDC 00409909412

## 2022-03-16 MED ADMIN — MULTIVITAMIN PO TAB [37167]: 1 | ORAL | @ 21:00:00 | NDC 00536354710

## 2022-03-16 MED ADMIN — PIPERACILLIN-TAZOBACTAM 4.5 GRAM IV SOLR [80419]: 4.5 g | INTRAVENOUS | @ 20:00:00 | NDC 60505615900

## 2022-03-16 MED ADMIN — ACETAMINOPHEN 1,000 MG/100 ML (10 MG/ML) IV SOLN [305632]: 1000 mg | INTRAVENOUS | @ 19:00:00 | Stop: 2022-03-16 | NDC 63323043441

## 2022-03-16 MED ADMIN — PIPERACILLIN-TAZOBACTAM 4.5 GRAM IV SOLR [80419]: 4.5 g | INTRAVENOUS | @ 08:00:00 | NDC 60505615900

## 2022-03-16 MED ADMIN — GABAPENTIN 100 MG PO CAP [18309]: 200 mg | ORAL | @ 22:00:00 | NDC 00904666561

## 2022-03-16 MED ADMIN — FENTANYL CITRATE (PF) 50 MCG/ML IJ SOLN [3037]: 50 ug | INTRAVENOUS | @ 13:00:00 | Stop: 2022-03-16 | NDC 00409909412

## 2022-03-16 MED ADMIN — SODIUM CHLORIDE 0.9 % IV PGBK (MB+) [95161]: 4.5 g | INTRAVENOUS | @ 02:00:00 | NDC 00338915930

## 2022-03-16 MED ADMIN — ACETAMINOPHEN 1,000 MG/100 ML (10 MG/ML) IV SOLN [305632]: 1000 mg | INTRAVENOUS | @ 11:00:00 | Stop: 2022-03-16 | NDC 63323043441

## 2022-03-16 MED ADMIN — FENTANYL CITRATE (PF) 50 MCG/ML IJ SOLN [3037]: 50 ug | INTRAVENOUS | @ 11:00:00 | Stop: 2022-03-16 | NDC 00409909412

## 2022-03-16 MED ADMIN — FENTANYL CITRATE (PF) 50 MCG/ML IJ SOLN [3037]: 50 ug | INTRAVENOUS | @ 08:00:00 | Stop: 2022-03-16 | NDC 00409909412

## 2022-03-16 MED ADMIN — PIPERACILLIN-TAZOBACTAM 4.5 GRAM IV SOLR [80419]: 4.5 g | INTRAVENOUS | @ 13:00:00 | NDC 60505615900

## 2022-03-16 MED ADMIN — SODIUM CHLORIDE 0.9 % IV PGBK (MB+) [95161]: 4.5 g | INTRAVENOUS | @ 20:00:00 | NDC 00338915930

## 2022-03-17 ENCOUNTER — Encounter: Admit: 2022-03-17 | Discharge: 2022-03-17 | Payer: BC Managed Care – PPO

## 2022-03-17 MED ADMIN — PIPERACILLIN-TAZOBACTAM 4.5 GRAM IV SOLR [80419]: 4.5 g | INTRAVENOUS | @ 02:00:00 | NDC 60505615900

## 2022-03-17 MED ADMIN — SODIUM CHLORIDE 0.9 % IV PGBK (MB+) [95161]: 4.5 g | INTRAVENOUS | @ 19:00:00 | NDC 00338915930

## 2022-03-17 MED ADMIN — MULTIVITAMIN PO TAB [37167]: 1 | ORAL | @ 13:00:00 | NDC 00536354710

## 2022-03-17 MED ADMIN — TRAMADOL 50 MG PO TAB [14632]: 100 mg | ORAL | @ 17:00:00 | NDC 65162062750

## 2022-03-17 MED ADMIN — TRAMADOL 50 MG PO TAB [14632]: 100 mg | ORAL | @ 22:00:00 | NDC 65162062750

## 2022-03-17 MED ADMIN — SODIUM CHLORIDE 0.9 % IV PGBK (MB+) [95161]: 4.5 g | INTRAVENOUS | @ 08:00:00 | NDC 00338915930

## 2022-03-17 MED ADMIN — SODIUM CHLORIDE 0.9 % IV PGBK (MB+) [95161]: 4.5 g | INTRAVENOUS | @ 13:00:00 | NDC 00338915930

## 2022-03-17 MED ADMIN — GABAPENTIN 100 MG PO CAP [18309]: 200 mg | ORAL | @ 19:00:00 | NDC 00904666561

## 2022-03-17 MED ADMIN — PIPERACILLIN-TAZOBACTAM 4.5 GRAM IV SOLR [80419]: 4.5 g | INTRAVENOUS | @ 19:00:00 | NDC 60505615900

## 2022-03-17 MED ADMIN — TRAMADOL 50 MG PO TAB [14632]: 100 mg | ORAL | @ 05:00:00 | NDC 65162062750

## 2022-03-17 MED ADMIN — PIPERACILLIN-TAZOBACTAM 4.5 GRAM IV SOLR [80419]: 4.5 g | INTRAVENOUS | @ 13:00:00 | NDC 60505615900

## 2022-03-17 MED ADMIN — ACETAMINOPHEN 325 MG PO TAB [101]: 650 mg | ORAL | @ 02:00:00 | NDC 00904677361

## 2022-03-17 MED ADMIN — GABAPENTIN 100 MG PO CAP [18309]: 200 mg | ORAL | @ 11:00:00 | NDC 00904666561

## 2022-03-17 MED ADMIN — TRAMADOL 50 MG PO TAB [14632]: 100 mg | ORAL | @ 11:00:00 | NDC 65162062750

## 2022-03-17 MED ADMIN — TRAMADOL 50 MG PO TAB [14632]: 100 mg | ORAL | NDC 65162062750

## 2022-03-17 MED ADMIN — SODIUM CHLORIDE 0.9 % IV PGBK (MB+) [95161]: 4.5 g | INTRAVENOUS | @ 02:00:00 | NDC 00338915930

## 2022-03-17 MED ADMIN — PIPERACILLIN-TAZOBACTAM 4.5 GRAM IV SOLR [80419]: 4.5 g | INTRAVENOUS | @ 08:00:00 | NDC 60505615900

## 2022-03-17 MED ADMIN — GABAPENTIN 100 MG PO CAP [18309]: 200 mg | ORAL | @ 02:00:00 | NDC 00904666561

## 2022-03-18 ENCOUNTER — Encounter: Admit: 2022-03-18 | Discharge: 2022-03-18 | Payer: BC Managed Care – PPO

## 2022-03-18 MED ADMIN — PIPERACILLIN-TAZOBACTAM 4.5 GRAM IV SOLR [80419]: 4.5 g | INTRAVENOUS | @ 02:00:00 | NDC 60505615900

## 2022-03-18 MED ADMIN — PIPERACILLIN-TAZOBACTAM 4.5 GRAM IV SOLR [80419]: 4.5 g | INTRAVENOUS | @ 09:00:00 | Stop: 2022-03-18 | NDC 60505615900

## 2022-03-18 MED ADMIN — SODIUM CHLORIDE 0.9 % IV PGBK (MB+) [95161]: 4.5 g | INTRAVENOUS | @ 02:00:00 | NDC 00338915930

## 2022-03-18 MED ADMIN — SODIUM CHLORIDE 0.9 % IV PGBK (MB+) [95161]: 4.5 g | INTRAVENOUS | @ 13:00:00 | Stop: 2022-03-18 | NDC 00338915930

## 2022-03-18 MED ADMIN — SODIUM CHLORIDE 0.9 % IV PGBK (MB+) [95161]: 4.5 g | INTRAVENOUS | @ 09:00:00 | Stop: 2022-03-18 | NDC 00338915930

## 2022-03-18 MED ADMIN — MULTIVITAMIN PO TAB [37167]: 1 | ORAL | @ 13:00:00 | Stop: 2022-03-18 | NDC 00536354710

## 2022-03-18 MED ADMIN — GABAPENTIN 100 MG PO CAP [18309]: 200 mg | ORAL | @ 11:00:00 | Stop: 2022-03-18 | NDC 00904666561

## 2022-03-18 MED ADMIN — TRAMADOL 50 MG PO TAB [14632]: 100 mg | ORAL | @ 06:00:00 | Stop: 2022-03-18 | NDC 65162062750

## 2022-03-18 MED ADMIN — DOCUSATE SODIUM 100 MG PO CAP [2566]: 100 mg | ORAL | @ 15:00:00 | Stop: 2022-03-18 | NDC 00904699880

## 2022-03-18 MED ADMIN — PIPERACILLIN-TAZOBACTAM 4.5 GRAM IV SOLR [80419]: 4.5 g | INTRAVENOUS | @ 13:00:00 | Stop: 2022-03-18 | NDC 60505615900

## 2022-03-18 MED ADMIN — ACETAMINOPHEN 325 MG PO TAB [101]: 650 mg | ORAL | @ 06:00:00 | Stop: 2022-03-18 | NDC 00904677361

## 2022-03-18 MED ADMIN — GABAPENTIN 100 MG PO CAP [18309]: 200 mg | ORAL | @ 01:00:00 | NDC 00904666561

## 2022-03-18 MED ADMIN — ACETAMINOPHEN 325 MG PO TAB [101]: 650 mg | ORAL | @ 01:00:00 | NDC 00904677361

## 2022-03-19 ENCOUNTER — Encounter: Admit: 2022-03-19 | Discharge: 2022-03-19 | Payer: BC Managed Care – PPO

## 2022-03-20 ENCOUNTER — Encounter: Admit: 2022-03-20 | Discharge: 2022-03-20 | Payer: BC Managed Care – PPO

## 2022-03-23 ENCOUNTER — Encounter: Admit: 2022-03-23 | Discharge: 2022-03-23 | Payer: BC Managed Care – PPO

## 2022-03-23 ENCOUNTER — Emergency Department: Admit: 2022-03-23 | Discharge: 2022-03-23 | Payer: BC Managed Care – PPO

## 2022-03-23 ENCOUNTER — Inpatient Hospital Stay: Admit: 2022-03-23 | Payer: BC Managed Care – PPO

## 2022-03-23 DIAGNOSIS — K5792 Diverticulitis of intestine, part unspecified, without perforation or abscess without bleeding: Secondary | ICD-10-CM

## 2022-03-23 LAB — COMPREHENSIVE METABOLIC PANEL
ALBUMIN: 3.1 g/dL — ABNORMAL LOW (ref 3.5–5.0)
ALK PHOSPHATASE: 173 U/L — ABNORMAL HIGH (ref 25–110)
ALT: 33 U/L (ref 7–56)
ANION GAP: 11 K/UL — ABNORMAL HIGH (ref 3–12)
AST: 27 U/L — ABNORMAL HIGH (ref 7–40)
BLD UREA NITROGEN: 14 mg/dL (ref 7–25)
CALCIUM: 8.6 mg/dL (ref 8.5–10.6)
CHLORIDE: 100 MMOL/L — ABNORMAL LOW (ref 98–110)
CO2: 24 MMOL/L (ref 21–30)
EGFR: >60 mL/min (ref >60)
GLUCOSE,PANEL: 90 mg/dL (ref 70–100)
POTASSIUM: 4.2 MMOL/L — ABNORMAL LOW (ref 3.5–5.1)
SODIUM: 135 MMOL/L — ABNORMAL LOW (ref 137–147)
TOTAL BILIRUBIN: 0.5 mg/dL (ref 0.3–1.2)
TOTAL PROTEIN: 7.3 g/dL — ABNORMAL HIGH (ref 6.0–8.0)

## 2022-03-23 LAB — CBC AND DIFF
ABSOLUTE BASO COUNT: 0 K/UL (ref 0–0.20)
ABSOLUTE EOS COUNT: 0 K/UL (ref 0–0.45)
ABSOLUTE MONO COUNT: 1.4 K/UL — ABNORMAL HIGH (ref 0–0.80)
WBC COUNT: 11 K/UL — ABNORMAL HIGH (ref 4.5–11.0)

## 2022-03-23 LAB — MAGNESIUM: MAGNESIUM: 1.9 mg/dL (ref 1.6–2.6)

## 2022-03-23 LAB — POC LACTATE: LACTIC ACID POC: 0.8 MMOL/L (ref 0.5–2.0)

## 2022-03-23 MED ORDER — PIPERACILLIN/TAZOBACTAM 4.5 G/100ML NS IVPB (MB+)
4.5 g | Freq: Once | INTRAVENOUS | 0 refills | Status: DC
Start: 2022-03-23 — End: 2022-03-24
  Administered 2022-03-24 (×2): 4.5 g via INTRAVENOUS

## 2022-03-23 MED ORDER — ONDANSETRON HCL (PF) 4 MG/2 ML IJ SOLN
4 mg | Freq: Once | INTRAVENOUS | 0 refills | Status: CP
Start: 2022-03-23 — End: ?
  Administered 2022-03-24: 05:00:00 4 mg via INTRAVENOUS

## 2022-03-23 MED ORDER — SODIUM CHLORIDE 0.9 % IJ SOLN
50 mL | Freq: Once | INTRAVENOUS | 0 refills | Status: CP
Start: 2022-03-23 — End: ?
  Administered 2022-03-24: 03:00:00 50 mL via INTRAVENOUS

## 2022-03-23 MED ORDER — FENTANYL CITRATE (PF) 50 MCG/ML IJ SOLN
50 ug | INTRAVENOUS | 0 refills | Status: DC | PRN
Start: 2022-03-23 — End: 2022-03-24
  Administered 2022-03-24: 05:00:00 50 ug via INTRAVENOUS

## 2022-03-23 MED ORDER — METRONIDAZOLE IN NACL (ISO-OS) 500 MG/100 ML IV PGBK
500 mg | Freq: Once | INTRAVENOUS | 0 refills | Status: DC
Start: 2022-03-23 — End: 2022-03-24

## 2022-03-23 MED ORDER — IOHEXOL 350 MG IODINE/ML IV SOLN
100 mL | Freq: Once | INTRAVENOUS | 0 refills | Status: CP
Start: 2022-03-23 — End: ?
  Administered 2022-03-24: 03:00:00 100 mL via INTRAVENOUS

## 2022-03-23 NOTE — ED Triage Notes
ED Initial Provider Note:    This patient was seen in the ED triage area to initiate and expedite the patients ED care when possible.    ED Chief Complaint:   Chief Complaint   Patient presents with    Abdominal pain     Bilateral lower abdominal pain, diarrhea, and fevers x3 days now after being discharged from here for diverticulitis with bowel perforation on 6/7. Pt was given antibiotics in hospital and outpatient as well as other meds outpatient. Pt's been fevering consistently and very diaphoretic as well.    Fever       S: Rodney Padilla is a 31 y.o. male who presents to the Emergency Department for dyspnea and worsening lower abdominal pain with nausea, diarrhea, and fevers after being discharged from recent diverticulitis treatment.  He had a microperforation and was discharged in the hospital still with a fever.  He has consistently had fevers with increasing pain and generally feeling unwell.    PMHx:  Medical History:   Diagnosis Date    Depression     Urinary tract infection        BP 134/74 (BP Source: Arm, Left Upper)  - Pulse 100  - Temp 37.3 C (99.1 F)  - Wt 73.5 kg (162 lb)  - SpO2 100%  - BMI 24.63 kg/m    O: Brief Physical: Appears to feel unwell, diaphoretic, pale, moderate lower abdominal tenderness bilaterally    A/P: The patient was seen by me as an initial provider in triage. A brief history and physical was obtained. My exam is intended to be an initial medial screening exam. Initial orders have been placed by me. My working diagnosis is perforation, diverticulitis, abscess, sepsis/SIRS, dehydration, electrolyte abnormality.    The patient is deemed appropriate for the main ED. The patient's care will be resumed by the ED provider care team once the patient is roomed in the ED. A more detailed / complete H&P will be documented by those providers.

## 2022-03-24 ENCOUNTER — Encounter: Admit: 2022-03-24 | Discharge: 2022-03-24 | Payer: BC Managed Care – PPO

## 2022-03-24 MED ORDER — FENTANYL CITRATE (PF) 50 MCG/ML IJ SOLN
25-50 ug | INTRAVENOUS | 0 refills | Status: AC | PRN
Start: 2022-03-24 — End: ?
  Administered 2022-03-24 (×3): 50 ug via INTRAVENOUS

## 2022-03-24 MED ORDER — ONDANSETRON 4 MG PO TBDI
4 mg | ORAL | 0 refills | Status: AC | PRN
Start: 2022-03-24 — End: ?

## 2022-03-24 MED ORDER — ENOXAPARIN 40 MG/0.4 ML SC SYRG
40 mg | Freq: Every day | SUBCUTANEOUS | 0 refills | Status: AC
Start: 2022-03-24 — End: ?
  Administered 2022-03-24 – 2022-03-27 (×3): 40 mg via SUBCUTANEOUS

## 2022-03-24 MED ORDER — ACETAMINOPHEN 1,000 MG/100 ML (10 MG/ML) IV SOLN
1000 mg | INTRAVENOUS | 0 refills | Status: AC
Start: 2022-03-24 — End: ?
  Administered 2022-03-24 – 2022-03-26 (×8): 1000 mg via INTRAVENOUS

## 2022-03-24 MED ORDER — PIPERACILLIN/TAZOBACTAM 4.5 G/100ML NS IVPB (MB+)
4.5 g | INTRAVENOUS | 0 refills | Status: AC
Start: 2022-03-24 — End: ?
  Administered 2022-03-24 – 2022-03-26 (×16): 4.5 g via INTRAVENOUS

## 2022-03-24 MED ORDER — ONDANSETRON HCL (PF) 4 MG/2 ML IJ SOLN
4 mg | INTRAVENOUS | 0 refills | Status: AC | PRN
Start: 2022-03-24 — End: ?
  Administered 2022-03-24: 23:00:00 4 mg via INTRAVENOUS

## 2022-03-24 MED ORDER — LACTATED RINGERS IV SOLP
INTRAVENOUS | 0 refills | Status: AC
Start: 2022-03-24 — End: ?
  Administered 2022-03-24 – 2022-03-25 (×3): 1000.000 mL via INTRAVENOUS

## 2022-03-24 MED ADMIN — OXYCODONE 5 MG/5 ML PO SOLN [10813]: 5 mg | ORAL | @ 21:00:00 | NDC 00904682805

## 2022-03-24 MED ADMIN — OXYCODONE 5 MG/5 ML PO SOLN [10813]: 5 mg | ORAL | @ 15:00:00 | NDC 64950035405

## 2022-03-24 MED ADMIN — DOCUSATE SODIUM 100 MG PO CAP [2566]: 100 mg | ORAL | @ 15:00:00 | NDC 00904699880

## 2022-03-24 NOTE — ED Notes
31 y.o. male presents to ED with cc of abdominal pain and fever. Pt reports he was recently admitted for a perforation in abdomen and diverticulitis.Pt reports being sent home with antibiotics but continues to have fevers/abdominal pain/diarrhea. Pt AxO x4, breathing even and unlabored on RA, skin race appropriate, pt on monitors, bed in lowest locked position, call light within reach.     Medical History:   Diagnosis Date    Depression     Urinary tract infection      All belongings remain with pt.

## 2022-03-24 NOTE — Unmapped
Thank you for choosing [facility] and the Department of Emergency Medicine for your healthcare needs.    You are seen in the emergency department for abdominal pain and fevers.  We consulted surgery who recommended **  Please take a antibiotic **   Please ensure that you follow-up with a primary care physician regarding this emergency department visit.    If your condition required a prescription medication, it has been e-prescribed (sent electronically by computer) to the preferred pharmacy that we have on file for you and you do not need a paper copy of the prescription.  Please pick up your medication from your pharmacy as soon as possible.  If your pharmacy does not yet accept electronic prescriptions, a paper copy of the prescription has been provided to you and you must take this to the pharmacy of your choice to have it filled.  Please review your after visit summary to ensure that we have the correct pharmacy on file for you.  Please let us know immediately if we need to update your information to reflect your current and preferred pharmacy.  Please contact us immediately if you encounter any problems related to the electronic prescribing process.  Please call or return to the emergency department if you are unable to obtain your prescription medication.  Any request for medication refills should be directed to your primary care physician.    Please follow up with the designated physician(s) as instructed.  If you do not have a primary care physician, you need to establish care with one.  Ask your physician to obtain your records and go over all results in detail.  Some of the results provided to you today may be preliminary results and significant changes will be provided to you as necessary.  However, there may be incidental findings unrelated to the reason(s) for today's visit that will require non-emergent follow up in the near future by you and your primary care physician. If you received any narcotic pain medications or sedatives while in the emergency department, you should NOT drive and you should not drive or operate machinery for 24 hours or while on those medications.    If your blood pressure was over 130/90, you should see your doctor to get your blood pressure rechecked.  Your doctor may start medications to control your blood pressure.      If your blood sugar was elevated, you should see your doctor to get your blood sugar rechecked.  Your doctor may start medications to control your blood sugar.      If you have been diagnosed with heart failure, weigh yourself daily and notify your physician of a weight gain of more than 2 pounds in a day or 3-5 pounds in a week.    If you were diagnosed with seizures, you may NOT drive for the next 6 months -- this is state law.  You should not drive, operate any heavy machinery, bathe or shower while home alone or with the bathroom door locked, swim alone, climb up on a ladder, use firearms, or do anything that would put you or others in danger should you have another seizure during that activity.  Please schedule an appointment for follow-up with both your primary care physician and with the department of neurology.    If you have a wound, we have done our best to clean and care for the injury.  There may be retained foreign bodies that could not be seen, found,  or removed.  Watch for signs of infection (redness, warmth, swelling, discharge, fever) and return to the emergency department, or follow-up with your regular physician, if any of these occur.  Sutures on the face should be removed in 5-7 days, or as otherwise instructed.  Sutures and staples on other areas of the body should be removed in 10-14 days, or as otherwise instructed.  Do not put any petroleum-based antibiotic ointment on wound adhesive (glue) because it will cause the adhesive to break down and fail.  You may safely shower and cleanse your repaired wounds with soap and water after 24 hours, but do not soak wounds or get them wet for prolonged periods of time (no soaking, bathing, or swimming).    Alcohol and drugs are bad for your health.  If you are under the age of 72 you cannot drink alcohol legally.  If you are 52 years of age or older and you drink alcohol, you should not drink in excess, you should only drink responsibly, and you should never drink and drive.  If you live in a state that has legalized marijuana, please use responsibly.  Do NOT use illegal drugs such as cocaine, methamphetamine, heroin, or phencyclidine.  If you are addicted to any of these substances and are ready to quit there are resources to help you do so.  Please ask any health care provider for help.    Smoking, vaping or chewing tobacco is bad for your health.  Do not start using these products.  If you use any form of tobacco, it is highly recommended that you stop using these products.  You may need help to quit using tobacco products and you should ask any of your health care providers about resources available to assist in quitting.       You may return to the emergency department at any time and for any health care concern that you believe is in need of emergent, urgent, or timely evaluation.

## 2022-03-25 ENCOUNTER — Encounter: Admit: 2022-03-25 | Discharge: 2022-03-25 | Payer: BC Managed Care – PPO

## 2022-03-25 ENCOUNTER — Inpatient Hospital Stay: Admit: 2022-03-25 | Discharge: 2022-03-25 | Payer: BC Managed Care – PPO

## 2022-03-25 DIAGNOSIS — N390 Urinary tract infection, site not specified: Secondary | ICD-10-CM

## 2022-03-25 DIAGNOSIS — F32A Depression: Secondary | ICD-10-CM

## 2022-03-25 MED ADMIN — OXYCODONE 5 MG/5 ML PO SOLN [10813]: 5 mg | ORAL | @ 19:00:00 | Stop: 2022-03-25 | NDC 00904682805

## 2022-03-25 MED ADMIN — DOCUSATE SODIUM 100 MG PO CAP [2566]: 100 mg | ORAL | @ 01:00:00 | NDC 00904699880

## 2022-03-25 MED ADMIN — DOCUSATE SODIUM 100 MG PO CAP [2566]: 100 mg | ORAL | @ 13:00:00 | NDC 00904718361

## 2022-03-25 MED ADMIN — FENTANYL CITRATE (PF) 50 MCG/ML IJ SOLN [3037]: 50 ug | INTRAVENOUS | @ 17:00:00 | Stop: 2022-03-25 | NDC 00409909425

## 2022-03-25 MED ADMIN — OXYCODONE 5 MG/5 ML PO SOLN [10813]: 5 mg | ORAL | @ 10:00:00 | Stop: 2022-03-25 | NDC 00904682805

## 2022-03-25 MED ADMIN — MIDAZOLAM 1 MG/ML IJ SOLN [10607]: 1 mg | INTRAVENOUS | @ 17:00:00 | Stop: 2022-03-25 | NDC 00641605701

## 2022-03-25 MED ADMIN — FENTANYL CITRATE (PF) 50 MCG/ML IJ SOLN [3037]: 50 ug | INTRAVENOUS | @ 18:00:00 | Stop: 2022-03-25 | NDC 00409909425

## 2022-03-25 MED ADMIN — OXYCODONE 5 MG/5 ML PO SOLN [10813]: 5 mg | ORAL | @ 05:00:00 | Stop: 2022-03-25 | NDC 00904682805

## 2022-03-25 MED ADMIN — OXYCODONE 5 MG/5 ML PO SOLN [10813]: 5 mg | ORAL | @ 01:00:00 | NDC 00904682805

## 2022-03-25 MED ADMIN — OXYCODONE 5 MG/5 ML PO SOLN [10813]: 10 mg | ORAL | @ 23:00:00 | Stop: 2022-03-26 | NDC 00904682805

## 2022-03-25 MED ADMIN — MIDAZOLAM 1 MG/ML IJ SOLN [10607]: 1 mg | INTRAVENOUS | @ 17:00:00 | Stop: 2022-03-25 | NDC 23155060031

## 2022-03-25 MED ADMIN — OXYCODONE 5 MG/5 ML PO SOLN [10813]: 5 mg | ORAL | @ 14:00:00 | Stop: 2022-03-25 | NDC 00904682805

## 2022-03-26 MED ADMIN — SODIUM CHLORIDE 0.9 % IJ SYRG [86437]: 10 mL | INTRAVENOUS | @ 15:00:00 | NDC 08290306546

## 2022-03-26 MED ADMIN — HYDROXYZINE HCL 25 MG PO TAB [3774]: 25 mg | ORAL | @ 23:00:00 | NDC 00904661761

## 2022-03-26 MED ADMIN — AMOXICILLIN-POT CLAVULANATE 875-125 MG PO TAB [33228]: 875 mg | ORAL | @ 15:00:00 | NDC 65862050301

## 2022-03-26 MED ADMIN — SODIUM CHLORIDE 0.9 % IJ SYRG [86437]: 10 mL | INTRAVENOUS | @ 03:00:00 | NDC 08290094010

## 2022-03-26 MED ADMIN — OXYCODONE 5 MG PO TAB [10814]: 10 mg | ORAL | @ 10:00:00 | NDC 47781026305

## 2022-03-26 MED ADMIN — OXYCODONE 5 MG PO TAB [10814]: 10 mg | ORAL | @ 03:00:00 | NDC 47781026305

## 2022-03-26 MED ADMIN — HYDROXYZINE HCL 25 MG PO TAB [3774]: 25 mg | ORAL | @ 06:00:00 | NDC 00904661761

## 2022-03-26 MED ADMIN — OXYCODONE 5 MG PO TAB [10814]: 10 mg | ORAL | @ 15:00:00 | NDC 47781026305

## 2022-03-26 MED ADMIN — OXYCODONE 10 MG PO TAB [166908]: 10 mg | ORAL | @ 19:00:00 | NDC 68084096811

## 2022-03-26 MED ADMIN — ACETAMINOPHEN 325 MG PO TAB [101]: 650 mg | ORAL | @ 21:00:00 | NDC 00904677361

## 2022-03-26 MED ADMIN — OXYCODONE 10 MG PO TAB [166908]: 10 mg | ORAL | @ 23:00:00 | NDC 68084096811

## 2022-03-26 MED ADMIN — AMOXICILLIN-POT CLAVULANATE 875-125 MG PO TAB [33228]: 875 mg | ORAL | @ 21:00:00 | NDC 65862050301

## 2022-03-27 MED ADMIN — OXYCODONE 5 MG PO TAB [10814]: 10 mg | ORAL | @ 07:00:00 | Stop: 2022-03-27 | NDC 47781026305

## 2022-03-27 MED ADMIN — DOCUSATE SODIUM 100 MG PO CAP [2566]: 100 mg | ORAL | @ 01:00:00 | NDC 00904699880

## 2022-03-27 MED ADMIN — AMOXICILLIN-POT CLAVULANATE 875-125 MG PO TAB [33228]: 875 mg | ORAL | @ 14:00:00 | Stop: 2022-03-27 | NDC 65862050301

## 2022-03-27 MED ADMIN — OXYCODONE 10 MG PO TAB [166908]: 10 mg | ORAL | @ 15:00:00 | Stop: 2022-03-27 | NDC 68084096811

## 2022-03-27 MED ADMIN — OXYCODONE 5 MG PO TAB [10814]: 10 mg | ORAL | @ 03:00:00 | NDC 47781026305

## 2022-03-27 MED ADMIN — OXYCODONE 5 MG PO TAB [10814]: 10 mg | ORAL | @ 11:00:00 | Stop: 2022-03-27 | NDC 47781026305

## 2022-03-27 MED ADMIN — SODIUM CHLORIDE 0.9 % IJ SYRG [86437]: 10 mL | INTRAVENOUS | @ 01:00:00 | NDC 08290306546

## 2022-03-27 MED ADMIN — SODIUM CHLORIDE 0.9 % IJ SYRG [86437]: 10 mL | INTRAVENOUS | @ 14:00:00 | Stop: 2022-03-27 | NDC 08290306546

## 2022-03-27 MED ADMIN — HYDROXYZINE HCL 25 MG PO TAB [3774]: 25 mg | ORAL | @ 14:00:00 | Stop: 2022-03-27 | NDC 00904661761

## 2022-03-27 MED ADMIN — ACETAMINOPHEN 325 MG PO TAB [101]: 650 mg | ORAL | @ 14:00:00 | Stop: 2022-03-27 | NDC 00904677361

## 2022-03-27 MED ADMIN — ACETAMINOPHEN 325 MG PO TAB [101]: 650 mg | ORAL | @ 01:00:00 | NDC 00904677361

## 2022-03-27 MED ADMIN — POLYETHYLENE GLYCOL 3350 17 GRAM PO PWPK [25424]: 8.5 g | ORAL | @ 14:00:00 | Stop: 2022-03-27 | NDC 00904693186

## 2022-03-27 MED ADMIN — DOCUSATE SODIUM 100 MG PO CAP [2566]: 100 mg | ORAL | @ 14:00:00 | Stop: 2022-03-27 | NDC 00904718361

## 2022-03-29 ENCOUNTER — Encounter: Admit: 2022-03-29 | Discharge: 2022-03-29 | Payer: BC Managed Care – PPO

## 2022-03-29 ENCOUNTER — Emergency Department: Admit: 2022-03-29 | Discharge: 2022-03-29 | Payer: BC Managed Care – PPO

## 2022-03-29 DIAGNOSIS — Z9889 Other specified postprocedural states: Secondary | ICD-10-CM

## 2022-03-29 DIAGNOSIS — K9189 Other postprocedural complications and disorders of digestive system: Secondary | ICD-10-CM

## 2022-03-29 LAB — COMPREHENSIVE METABOLIC PANEL
EGFR: >60 mL/min (ref >60)
POTASSIUM: 5.2 MMOL/L — ABNORMAL HIGH (ref 3.5–5.1)

## 2022-03-29 LAB — CBC AND DIFF
BASOPHILS %: 1 % (ref 0–2)
HEMATOCRIT: 39 % — ABNORMAL LOW (ref 40–50)
HEMOGLOBIN: 13 g/dL — ABNORMAL LOW (ref 13.5–16.5)
MCH: 30 pg — ABNORMAL LOW (ref 26–34)
MCV: 92 FL (ref 80–100)
RBC COUNT: 4.2 M/UL — ABNORMAL LOW (ref 4.4–5.5)
WBC COUNT: 6.8 K/UL (ref 4.5–11.0)

## 2022-03-29 LAB — URINALYSIS DIPSTICK REFLEX TO CULTURE
LEUKOCYTES: NEGATIVE U/L — ABNORMAL HIGH (ref 7–56)
NITRITE: NEGATIVE MMOL/L (ref 21–30)
PROTEIN,UA: NEGATIVE mg/dL (ref 8.5–10.6)
URINE ASCORBIC ACID, UA: NEGATIVE K/UL (ref 3–12)
URINE BLOOD: NEGATIVE U/L — ABNORMAL HIGH (ref 25–110)

## 2022-03-29 LAB — URINALYSIS MICROSCOPIC REFLEX TO CULTURE

## 2022-03-29 LAB — POC LACTATE: LACTIC ACID POC: 1.8 MMOL/L (ref 0.5–2.0)

## 2022-03-29 MED ORDER — SULFAMETHOXAZOLE-TRIMETHOPRIM 800-160 MG PO TAB
1 | ORAL_TABLET | Freq: Two times a day (BID) | ORAL | 0 refills | Status: AC
Start: 2022-03-29 — End: ?
  Filled 2022-03-30: qty 20, 10d supply, fill #1

## 2022-03-29 MED ORDER — LORAZEPAM 2 MG/ML IJ SOLN
1 mg | Freq: Once | INTRAVENOUS | 0 refills | Status: CP
Start: 2022-03-29 — End: ?
  Administered 2022-03-29: 16:00:00 1 mg via INTRAVENOUS

## 2022-03-29 MED ORDER — SODIUM CHLORIDE 0.9 % IJ SOLN
50 mL | Freq: Once | INTRAVENOUS | 0 refills | Status: CP
Start: 2022-03-29 — End: ?
  Administered 2022-03-29: 16:00:00 50 mL via INTRAVENOUS

## 2022-03-29 MED ORDER — IOHEXOL 350 MG IODINE/ML IV SOLN
80 mL | Freq: Once | INTRAVENOUS | 0 refills | Status: CP
Start: 2022-03-29 — End: ?
  Administered 2022-03-29: 16:00:00 80 mL via INTRAVENOUS

## 2022-03-29 MED ORDER — FLUCONAZOLE 200 MG PO TAB
200 mg | ORAL_TABLET | Freq: Every day | ORAL | 0 refills | 3.00000 days | Status: AC
Start: 2022-03-29 — End: ?
  Filled 2022-03-30: qty 10, 10d supply, fill #1

## 2022-03-29 NOTE — ED Notes
Pt to [place] ER with c/o drain problem. Pt states he had a vacuum drain placed for an abdominal abscess and was discharged home. Since discharge, pt states it has been draining appropriately until this morning. States he attempted to flush the drain when he met resistance and felt pressure in his abdomen. Pt phoned in and was referred to the ER for further evaluation. Denies further complaints or concerns at this time. Pt A&O x4, resp even and NL, VSS, ambulatory with a steady gait.

## 2022-03-29 NOTE — Consults
[facility] General Surgery Consult     Patient: Rodney Padilla, [id number]  Admission Date:  03/29/2022, LOS: 0 days  Admission Diagnosis: No admission diagnoses are documented for this encounter.  Date of Service: March 29, 2022  CONSULT EMERGENCY GENERAL SURGERY PHYSICIAN  Consult performed by: Garald Braver, DO  Consult ordered by: Carleene Mains, MD           ASSESSMENT:   Rodney Padilla is a 31 y.o. male with depression and contained perforated diverticulitis managed medically c/b intraabdominal abscess s/p IR drain (6/14) presenting to the ED with IR drain malfunction    PLAN:  - No acute surgical intervention. Patient with reassuring abdominal exam and Able to flush drain in ED. CT A/P demonstrates decreased in size of pericolonic abscess with indwelling drain and slight overall improvement of acute perforated sigmoid diverticulitis.   - Continue to flush drains BID, monitor and record drain output daily  - Drain cultures notable for e.coli, enterococcus, actinomyces, bacteroides fragilis, clostridium, candida albicans; continue 10 day course of augmentin, start diflucan and bactrim for 10 days (ordered)  - Follow up in EGS clinic on 6/21, patient requests GI outpatient referral for colonoscopy at outpatient appointment  - Dispo per ED; ok to discharge with diflucan/bactrim, drain care instructions and close follow up in EGS clinic    Discussed with Dr. Massie Maroon, who directed plan of care.    Lynnda Shields, DO  PGY 1 Service Pager: [phone]  _____________________________________________________________________________    HPI:   Rodney Padilla is a 31 y.o. male with depression and contained perforated diverticulitis managed medically c/b intraabdominal abscess s/p IR drain (6/14) presenting to the ED with IR drain malfunction. He reports that the drain has been draining and flushing appropriately until this morning. Unable to to flush drain, flushing was met with resistance and abdominal abdominal pressure. Pain otherwise controlled. Drain had 60cc output yesterday. Output color has changed from tan/brown and cloudy to re-tinged and cloudy. Subjective fever, no clinical fever recorded. Tmax 100.56F. Tolerating diet without N/V. Otherwise doing well since discharge. Taking antibiotics as directed since discharge.     CT A/P demonstrates decreased in size of pericolonic abscess with indwelling drain and slight overall improvement of acute perforated sigmoid diverticulitis. Ongoing inflammation abutting loops of small bowel, bladder and rectosigmoid junction. Labs significant for no leukocytosis wbc 6.8, Hgb 13.0, K 5.2 (hemolyzed), Cr 0.56, mild elevation in AST/ALT. Drain cultures notable for e.coli, enterococcus, actinomyces, bacteroides fragilis, clostridium, candida albicans.        Medical History:   Diagnosis Date   ? Depression    ? Urinary tract infection      Surgical History:   Procedure Laterality Date   ? BROKEN BONES/ FRACTURES Left     broken leg   ? BROKEN BONES/ FRACTURES Left     hand   ? GALLBLADDER SURGERY     ? HX CHOLECYSTECTOMY       Family History   Problem Relation Age of Onset   ? Hernia Mother    ? Pancreatitis Mother    ? Heart Attack Father    ? Diabetes Father      Social History     Tobacco Use   ? Smoking status: Every Day     Packs/day: 0.50     Types: Cigarettes   ? Smokeless tobacco: Never   Vaping Use   ? Vaping Use: Never used   Substance Use Topics   ? Alcohol  use: Yes     Alcohol/week: 3.0 standard drinks of alcohol     Types: 3 Cans of beer per week     Your Current Medications:       Instructions    acetaminophen (TYLENOL) 325 mg tablet Take two tablets by mouth every 4 hours as needed.    amoxicillin-potassium clavulanate (AUGMENTIN) 875/125 mg tablet Take one tablet by mouth every 12 hours for 5 days. Indications: intra-abdominal infection    docusate (COLACE) 100 mg capsule Take one capsule by mouth twice daily as needed. hydrOXYzine HCL (ATARAX) 25 mg tablet Take one tablet by mouth every 6 hours as needed.    ibuprofen (ADVIL) 200 mg tablet Take four tablets to six tablets by mouth every 6-8 hours as needed for Pain. Take with food.    oxyCODONE (ROXICODONE) 5 mg tablet Take one tablet by mouth every 4 hours as needed.    polyethylene glycol 3350 (MIRALAX) 17 g packet Take one-half packet by mouth daily.    sodium chloride 0.9 % (flush) (NORMAL SALINE FLUSH) syringe Inject 10 mL to area(s) as directed as Needed.    traMADoL (ULTRAM) 50 mg tablet Take one tablet to two tablets by mouth every 4 hours as needed.          Review of Systems:  A 12 point review of symptoms was performed and was negative unless otherwise noted in the HPI.    Vitals:  BP: (156)/(74)   Temp:  [36.9 ?C (98.4 ?F)]   Pulse:  [89]   Respirations:  [16 PER MINUTE]   SpO2:  [100 %]   O2 Device: None (Room air)    Physical Exam:  GEN: alert and oriented no acute distress  HEENT: normocephalic, atraumatic  CARDIO: RRR, peripheral pulses wnl  PULM: normal work of breathing, no stridor  ABD: soft, NT, ND, no guarding, no rebound, jvac with mild thick, blood-tinged cloudy output  EXT: no c/c/e, warm, well-perfused  NEURO: motor and sensation grossly intact  PSYCH: cooperative, appropriate mood and affect    Lab/Radiology/Other Diagnostic Tests:  Lab Results   Component Value Date/Time    NA 136 (L) 03/29/2022 09:57 AM    K 5.2 (H) 03/29/2022 09:57 AM    CL 99 03/29/2022 09:57 AM    CO2 25 03/29/2022 09:57 AM    BUN 11 03/29/2022 09:57 AM    CR 0.56 03/29/2022 09:57 AM    MG 2.1 03/26/2022 06:20 AM    PO4 3.5 03/26/2022 06:20 AM        Lab Results   Component Value Date/Time    HGB 13.0 (L) 03/29/2022 09:57 AM    HCT 39.3 (L) 03/29/2022 09:57 AM    WBC 6.8 03/29/2022 09:57 AM    PLTCT 492 (H) 03/29/2022 09:57 AM     No results found for: GLUPOC     CT ABD/PELV W CONTRAST   Final Result      1.  Decrease in size of a pericolonic abscesses with indwelling percutaneous drain.      2.  Slightly improved acute perforated sigmoid diverticulitis. Extensive inflammation abuts loops of small bowel, the bladder, and rectosigmoid junction without discrete fistula.      3.  Trace ascites.          Finalized by Jake Bathe, M.D. on 03/29/2022 10:56 AM. Dictated by Jake Bathe, M.D. on 03/29/2022 10:51 AM.

## 2022-03-30 ENCOUNTER — Encounter: Admit: 2022-03-30 | Discharge: 2022-03-30 | Payer: BC Managed Care – PPO

## 2022-03-30 NOTE — Telephone Encounter
1230: Received VM from patient requesting a call back related to medication questions. Patient states he was prescribed 2 antibiotics from ER visit 03/29/2022 but his home pharmacy denies having any ready for him. After reviewing discharge documentation, both antibiotics were sent to the outpatient [place] pharmacy. Patient states that he could pick those up today.

## 2022-03-31 ENCOUNTER — Encounter: Admit: 2022-03-31 | Discharge: 2022-03-31 | Payer: BC Managed Care – PPO

## 2022-04-01 ENCOUNTER — Encounter: Admit: 2022-04-01 | Discharge: 2022-04-01 | Payer: BC Managed Care – PPO

## 2022-04-01 ENCOUNTER — Ambulatory Visit: Admit: 2022-04-01 | Discharge: 2022-04-02 | Payer: BC Managed Care – PPO

## 2022-04-01 DIAGNOSIS — K5720 Diverticulitis of large intestine with perforation and abscess without bleeding: Secondary | ICD-10-CM

## 2022-04-01 DIAGNOSIS — F32A Depression: Secondary | ICD-10-CM

## 2022-04-01 DIAGNOSIS — N390 Urinary tract infection, site not specified: Secondary | ICD-10-CM

## 2022-04-01 MED ORDER — TRAMADOL 50 MG PO TAB
50-100 mg | ORAL_TABLET | ORAL | 0 refills | Status: AC | PRN
Start: 2022-04-01 — End: ?

## 2022-04-01 NOTE — Patient Instructions
Please contact our clinic if you have any questions or concerns. Call us at [phone] or send an email through the MyChart system if you have non-urgent concerns. We will get back to you as soon as possible. For urgent or after hours needs, please call [phone] and ask for the ACS on call provider.     NOTE: MyChart messages and phone calls received on weekends, on holidays, and after 4 pm on weekdays will NOT be seen until the following business day. If you have an urgent matter during these times, please call [phone] to reach the on-call team.     If you need prescription refills, please contact your pharmacy.     Our fax number is [phone]. Please be sure your name and date of birth are on any forms that are sent to us for completion. We will have them filled out and signed as soon as possible, but our providers are not in clinic every day, so it can take up to a week.     Lab and test results:  As a part of the CARES act, starting 01/11/2020, some results will be released to you via MyChart immediately and automatically.  You may see results before your provider sees them; however, your provider will review all these results and then they, or one of their team, will notify you of result information and recommendations.   Critical results will be addressed immediately, but otherwise, please allow us time to get back with you prior to you reaching out to us for questions.  This will usually take about 72 hours for labs and 5-7 days for procedure test results.

## 2022-04-02 ENCOUNTER — Encounter: Admit: 2022-04-02 | Discharge: 2022-04-02 | Payer: BC Managed Care – PPO

## 2022-04-02 DIAGNOSIS — K5740 Diverticulitis of both small and large intestine with perforation and abscess without bleeding: Secondary | ICD-10-CM

## 2022-04-02 NOTE — Telephone Encounter
Received VM from patient's wife requesting a return call to discuss receiving the incorrect form of saline from [facility].    Called patient's number. Left a voicemail asking for a return call to discuss    1303: Patient returned call. Patient states [facility] gave him bottles of Saline instead of Saline flushes. RN already reached out to [facility] prior to this call and confirmed they do not stock Saline flushes. Patient agreeable to picking up Saline flushes from [place] outpatient pharmacy if a new prescription could be sent.    1315: Spoke with N. Fisher, APP and she will place a new order

## 2022-04-06 ENCOUNTER — Encounter: Admit: 2022-04-06 | Discharge: 2022-04-06 | Payer: BC Managed Care – PPO

## 2022-04-06 MED FILL — SODIUM CHLORIDE 0.9 % IJ SYRG: INTRAMUSCULAR | 10 days supply | Qty: 200 | Fill #1 | Status: CP

## 2022-04-13 ENCOUNTER — Ambulatory Visit: Admit: 2022-04-13 | Discharge: 2022-04-13 | Payer: BC Managed Care – PPO

## 2022-04-13 ENCOUNTER — Encounter: Admit: 2022-04-13 | Discharge: 2022-04-13 | Payer: BC Managed Care – PPO

## 2022-04-13 DIAGNOSIS — K5720 Diverticulitis of large intestine with perforation and abscess without bleeding: Secondary | ICD-10-CM

## 2022-04-13 MED ORDER — IOHEXOL 350 MG IODINE/ML IV SOLN
100 mL | Freq: Once | INTRAVENOUS | 0 refills | Status: CP
Start: 2022-04-13 — End: ?
  Administered 2022-04-13: 21:00:00 100 mL via INTRAVENOUS

## 2022-04-13 MED ORDER — SODIUM CHLORIDE 0.9 % IJ SOLN
50 mL | Freq: Once | INTRAVENOUS | 0 refills | Status: CP
Start: 2022-04-13 — End: ?
  Administered 2022-04-13: 21:00:00 50 mL via INTRAVENOUS

## 2022-04-15 ENCOUNTER — Encounter: Admit: 2022-04-15 | Discharge: 2022-04-15 | Payer: BC Managed Care – PPO

## 2022-04-15 ENCOUNTER — Ambulatory Visit: Admit: 2022-04-15 | Discharge: 2022-04-16 | Payer: BC Managed Care – PPO

## 2022-04-15 DIAGNOSIS — K5720 Diverticulitis of large intestine with perforation and abscess without bleeding: Secondary | ICD-10-CM

## 2022-04-15 DIAGNOSIS — N390 Urinary tract infection, site not specified: Secondary | ICD-10-CM

## 2022-04-15 DIAGNOSIS — F32A Depression: Secondary | ICD-10-CM

## 2022-04-15 NOTE — Progress Notes
Date of Service: 04/15/2022    Subjective:             Rodney Padilla is a 31 y.o. male with history of acute complicated sigmoid diverticulitis.    History of Present Illness  Rodney Padilla is a 31 y.o. male with history of acute complicated sigmoid diverticulitis first diagnosed 03/13/2022, managed conservatively with oral antibiotics, and then with IR-placed percutaneous drain 03/25/2022 for a multifocal gas/fluid collection.     Since last visit 04/01/2022, he has been doing well continuing with drain management of his sigmoid diverticulitis. He reports the output has slowed in the last few days and the output has become more dark brown in color. In the last 24 hours, he has had about 5mL of output. Last night, he noted that when he flushed the Jvac drain, the flush came out around the drain. He has not flushed since then. He reports minimal pain at the drain insertion site, tolerates a normal oral intake, denies nausea, vomiting, change in bowel movements. He does endorse intermittent pain with bowel movements ongoing since his presentation of diverticulitis.     He had a CT scan completed 04/13/2022 which demonstrated near complete resolution of the paracolic gas/fluid collection and persistent findings of acute diverticulitis. There eis no evidence of new gas/fluid collections.      Review of Systems   Constitutional: Negative for appetite change, chills and fever.   HENT: Negative.    Respiratory: Negative.    Cardiovascular: Negative.    Gastrointestinal: Negative for abdominal pain, constipation, nausea and vomiting.   Genitourinary: Positive for dysuria.   Musculoskeletal: Negative.    Neurological: Negative.      Objective:         ? acetaminophen (TYLENOL) 325 mg tablet Take two tablets by mouth every 4 hours as needed.   ? docusate (COLACE) 100 mg capsule Take one capsule by mouth twice daily as needed.   ? hydrOXYzine HCL (ATARAX) 25 mg tablet Take one tablet by mouth every 6 hours as needed. ? ibuprofen (ADVIL) 200 mg tablet Take four tablets to six tablets by mouth every 6-8 hours as needed for Pain. Take with food.   ? oxyCODONE (ROXICODONE) 5 mg tablet Take one tablet by mouth every 4 hours as needed.   ? polyethylene glycol 3350 (MIRALAX) 17 g packet Take one-half packet by mouth daily.   ? sodium chloride 0.9 % (flush) (NORMAL SALINE FLUSH) syringe Inject 10 mL to area(s) as directed twice daily for 10 days.   ? sodium chloride 0.9 % (flush) (NORMAL SALINE FLUSH) syringe Inject 10 mL to area(s) as directed as Needed.   ? traMADoL (ULTRAM) 50 mg tablet Take one tablet to two tablets by mouth every 4 hours as needed. Indications: pain     Vitals:    04/15/22 1313   BP: 126/71   BP Source: Arm, Right Upper   Pulse: 96   Temp: 36.8 ?C (98.2 ?F)   Resp: 16   TempSrc: Oral   PainSc: Three   Weight: 71.3 kg (157 lb 3.2 oz)   Height: 170.2 cm (5' 7)     Body mass index is 24.62 kg/m?Marland Kitchen     Physical Exam  Constitutional:       General: He is not in acute distress.     Appearance: Normal appearance.   HENT:      Head: Normocephalic and atraumatic.      Nose: Nose normal.   Eyes:  General: No scleral icterus.     Extraocular Movements: Extraocular movements intact.      Pupils: Pupils are equal, round, and reactive to light.   Pulmonary:      Effort: Pulmonary effort is normal.      Breath sounds: Normal breath sounds.   Abdominal:      General: Abdomen is flat. There is no distension.      Palpations: Abdomen is soft.      Tenderness: There is no abdominal tenderness.      Comments: Suprapubic Jvac drain in place, with minimal tan serous output   Musculoskeletal:         General: Normal range of motion.      Cervical back: Normal range of motion and neck supple.   Skin:     General: Skin is warm and dry.   Neurological:      Mental Status: Mental status is at baseline.       Imaging:   CT abd/pelv (04/13/2022)   IMPRESSION     Persistent findings of acute diverticulitis. There has been near complete resolution of the dominant paracolic abscess with indwelling percutaneous   drainage catheter. No new abdominopelvic fluid collection is identified.       ?Finalized by Prince Rome, D.O. on 04/13/2022 4:35 PM. Dictated by Prince Rome, D.O. on 04/13/2022 4:26 PM.        Assessment and Plan:  Rodney Padilla is a 31 y.o. male with recent history of acute sigmoid diverticulitis with resolution of percutaneous drain output and resolution of gas/fluid collection on recent CT.   - Jvac drain removed today in clinic  - Referral placed to GI for outpatient colonoscopy, planned for ~6 weeks from today   - Referral placed to Colorectal Surgery team for discussion of future sigmoid colectomy for management of diverticulitis     Patient was seen and discussed with Dr. Noel Journey.   Marylin Crosby, M4    ATTESTATION    I personally performed or re-performed the history, physical exam and treatment plan for the E/M. I discussed the case with the Medical Student, and concur with the Medical Student documentation of history, physical exam and treatment plan unless otherwise noted.    Resident name:  Lalla Brothers, MD Date:  04/15/2022    Pager [phone]               ATTESTATION    I personally performed the key portions of the E/M visit, discussed case with resident and concur with resident documentation of history, physical exam, assessment, and treatment plan unless otherwise noted.    Staff name:  Rayburn Felt, MD Date: 04/15/2022

## 2022-04-15 NOTE — Patient Instructions
Please contact our clinic if you have any questions or concerns. Call us at [phone] or send an email through the MyChart system if you have non-urgent concerns. We will get back to you as soon as possible. For urgent or after hours needs, please call [phone] and ask for the ACS on call provider.     NOTE: MyChart messages and phone calls received on weekends, on holidays, and after 4 pm on weekdays will NOT be seen until the following business day. If you have an urgent matter during these times, please call [phone] to reach the on-call team.     If you need prescription refills, please contact your pharmacy.     Our fax number is [phone]. Please be sure your name and date of birth are on any forms that are sent to us for completion. We will have them filled out and signed as soon as possible, but our providers are not in clinic every day, so it can take up to a week.     Lab and test results:  As a part of the CARES act, starting 01/11/2020, some results will be released to you via MyChart immediately and automatically.  You may see results before your provider sees them; however, your provider will review all these results and then they, or one of their team, will notify you of result information and recommendations.   Critical results will be addressed immediately, but otherwise, please allow us time to get back with you prior to you reaching out to us for questions.  This will usually take about 72 hours for labs and 5-7 days for procedure test results.

## 2022-04-24 ENCOUNTER — Ambulatory Visit: Admit: 2022-04-24 | Discharge: 2022-04-24 | Payer: BC Managed Care – PPO

## 2022-04-24 ENCOUNTER — Encounter: Admit: 2022-04-24 | Discharge: 2022-04-24 | Payer: BC Managed Care – PPO

## 2022-04-24 DIAGNOSIS — K5720 Diverticulitis of large intestine with perforation and abscess without bleeding: Secondary | ICD-10-CM

## 2022-04-24 NOTE — Telephone Encounter
Received Vm from patient requesting a return call due to blood in stools    1345: Left message to call back

## 2022-04-24 NOTE — Telephone Encounter
Received VM from patient that having blood in BM.     15:25 - call to patient.  States that blood in stool has been occurring about past 3-4 days - has bright red blood in stool and some bright red mucus.  Denies diarrhea and confirms the color is bright red.  States when he has BM, about 1/4 of the stool is bright red.  Abdominal pain is about 1-2/10 and in middle lower abdomen and in area about the size of his hand.  Denies lumps or firm areas in abdomen.  Denies fever/chills and dizziness but when lying down and gets up, he feels like he "got up too fast".  Denies nausea/vomiting and is able to eat/drink without problems.    Also states that when he urinates, it feels as if glass is in his urine - has been having this problem since discharged from the hospital and has not relieved.  Will forward concerns to provider and call patient back.  Encouraged to continue drinking plenty of water/fluids.  Patient agrees with plan.     Discussed with Gerri Spore, NP.  Direction that patient needs follow up in ED for further evaluation.     15:40 - call back to patient.  Discussed concerns and that it is recommended that he be further evaluated in ED.  Patient appreciated information.

## 2022-04-25 ENCOUNTER — Emergency Department
Admit: 2022-04-25 | Discharge: 2022-04-25 | Disposition: A | Discharge status: Home / Self Care | Payer: BC Managed Care – PPO

## 2022-04-25 ENCOUNTER — Encounter: Admit: 2022-04-25 | Discharge: 2022-04-25 | Payer: BC Managed Care – PPO

## 2022-04-25 DIAGNOSIS — K921 Melena: Secondary | ICD-10-CM

## 2022-04-25 LAB — COMPREHENSIVE METABOLIC PANEL
ANION GAP: 11 K/UL — ABNORMAL HIGH (ref 3–12)
CO2: 25 MMOL/L (ref 21–30)
EGFR: >60 mL/min (ref >60)
SODIUM: 140 MMOL/L — ABNORMAL LOW (ref 137–147)

## 2022-04-25 LAB — URINALYSIS DIPSTICK REFLEX TO CULTURE
NITRITE: NEGATIVE U/L (ref 7–40)
URINE ASCORBIC ACID, UA: POSITIVE K/UL — AB (ref 1.0–4.8)
URINE KETONE: NEGATIVE g/dL (ref 6.0–8.0)

## 2022-04-25 LAB — URINALYSIS MICROSCOPIC REFLEX TO CULTURE

## 2022-04-25 LAB — CBC AND DIFF
ABSOLUTE BASO COUNT: 0 K/UL (ref 0–0.20)
LYMPHOCYTES %: 31 % (ref 24–44)
MCH: 31 pg (ref 26–34)
MDW (MONOCYTE DISTRIBUTION WIDTH): 17 (ref <20.7)
MONOCYTES %: 14 % — ABNORMAL HIGH (ref 4–12)
WBC COUNT: 7.1 K/UL (ref 4.5–11.0)

## 2022-04-25 LAB — PTT (APTT): PTT: 33 s — ABNORMAL LOW (ref 24.0–36.5)

## 2022-04-25 LAB — HIV 1 & 2 AG-AB SCRN W REFLEX TO HIV CONFIRMATION

## 2022-04-25 LAB — PROTIME INR (PT): PROTIME: 12 s — ABNORMAL LOW (ref 9.5–14.2)

## 2022-04-25 NOTE — ED Notes
Called for patient 4 time no answr [phone]

## 2022-04-25 NOTE — Unmapped
You are seen at the [facility] emergency department for bright red blood mixed in with your stool.  While you were here we did not detect any ongoing bleeding and your blood work showed stable hemoglobin.  We tested your urine which did not show any signs of urinary tract infection.  We also tested for chlamydia and gonorrhea, which do sometimes cause burning with urination, and you will be able to access your results via patient portal when they result.  If they come back as positive, you will be contacted by a provider regarding treatment.  We feel you are safe to go home at this time but do recommend that you return if you notice brisk rectal bleeding, black tarry stools, chest pain, dizziness so severe that you pass out, or other concerning symptoms.  We have provided the information for family medicine and recommend that you follow-up with them as needed for ongoing symptoms.

## 2022-04-25 NOTE — ED Provider Notes
Rodney Padilla is a 31 y.o. male.    Chief Complaint:  Chief Complaint   Patient presents with   ? Melena     PT was recently seen at [facility] 6/18 for diverticulitis. PT states that Rodney Padilla finished abx for it. Pt states that Rodney Padilla started having bloody stools 4 days ago, but last night blood was much darker in color. Pt endorses lower abd pain. Pt denies vomiting/fevers/chills   ? Urinary Pain     Pt states that  Rodney Padilla has had pain with urination since June. Denies hematuria, flank pain. Pt states it feels like Rodney Padilla is peeing glass.        History of Present Illness:  Rodney Padilla is a 31yoM with pmh significant for recent acute complicated sigmoid diverticulitis first diagnosed 03/13/2022, managed conservatively with oral antibiotics, and then with IR-placed percutaneous drain 03/25/2022 for a multifocal gas/fluid collection, presenting with hematochezia. Patient states for the past 4-5 days Rodney Padilla has had bright red blood mixed into his stool with some additional mucous with BMs, which have occurred roughly 3x per day.  Patient states bleeding has lessened and become much darker in color but denies melena.  Patient endorses b/l lower abdominal cramping/aching radiating into his anus relieved by BM. Patient also endorses dysuria that has been going on for a few weeks without hematuria, urinary frequency/urgency, or flank pain.  Patient denies history of STDs but has had exposure and partner treatment without positive test at the time.  Patient endorses prior anal receptive intercourse without recurrence over the past month.  Patient denies history of GI bleed.          Review of Systems:  Review of Systems   Constitutional: Negative for fever.   Respiratory: Negative for shortness of breath.    Cardiovascular: Negative for chest pain.   Gastrointestinal: Positive for abdominal pain and blood in stool. Negative for constipation and diarrhea.   Genitourinary: Positive for dysuria. Negative for difficulty urinating, flank pain, frequency and urgency.   Neurological: Positive for light-headedness.   Psychiatric/Behavioral: Negative for dysphoric mood.       Allergies:  Patient has no known allergies.    Past Medical History:  Medical History:   Diagnosis Date   ? Depression    ? Urinary tract infection        Past Surgical History:  Surgical History:   Procedure Laterality Date   ? BROKEN BONES/ FRACTURES Left     broken leg   ? BROKEN BONES/ FRACTURES Left     hand   ? GALLBLADDER SURGERY     ? HX CHOLECYSTECTOMY         Pertinent medical/surgical history reviewed    Social History:  Social History     Tobacco Use   ? Smoking status: Every Day     Packs/day: 0.50     Types: Cigarettes   ? Smokeless tobacco: Never   Vaping Use   ? Vaping Use: Never used   Substance Use Topics   ? Alcohol use: Yes     Alcohol/week: 3.0 standard drinks of alcohol     Types: 3 Cans of beer per week   ? Drug use: Yes     Types: Marijuana     Social History     Substance and Sexual Activity   Drug Use Yes   ? Types: Marijuana             Family History:  Family History   Problem Relation  Age of Onset   ? Hernia Mother    ? Pancreatitis Mother    ? Heart Attack Father    ? Diabetes Father        Vitals:  ED Vitals    Date and Time T BP P RR SPO2P SPO2 User   04/25/22 1757 -- 111/62 -- -- -- -- JS   04/25/22 1735 -- -- -- -- 89 100 % JS   04/25/22 1702 -- 110/70 -- -- 77 99 % BL   04/25/22 1630 -- 127/74 -- -- 73 97 % BL   04/25/22 1611 -- 125/74 -- -- 74 97 % BL   04/25/22 1500 -- 118/72 -- -- 82 97 % BL   04/25/22 1430 -- 122/75 -- -- 75 98 % BL   04/25/22 1326 -- 126/75 -- -- 82 100 % SB   04/25/22 1135 36.8 ?C (98.2 ?F) 118/69 90 18 PER MINUTE -- 100 % RH          Physical Exam:  Physical Exam  Vitals and nursing note reviewed. Exam conducted with a chaperone present.   Constitutional:       General: Rodney Padilla is not in acute distress.     Appearance: Normal appearance. Rodney Padilla is not toxic-appearing.   HENT:      Head: Normocephalic and atraumatic.      Nose: Nose normal.      Mouth/Throat:      Mouth: Mucous membranes are moist.      Pharynx: Oropharynx is clear.   Eyes:      Extraocular Movements: Extraocular movements intact.      Conjunctiva/sclera: Conjunctivae normal.      Pupils: Pupils are equal, round, and reactive to light.   Cardiovascular:      Rate and Rhythm: Normal rate and regular rhythm.      Pulses: Normal pulses.      Heart sounds: Normal heart sounds.   Pulmonary:      Effort: Pulmonary effort is normal.      Breath sounds: Normal breath sounds. No stridor. No wheezing, rhonchi or rales.   Abdominal:      General: Abdomen is flat. There is no distension.      Palpations: Abdomen is soft.      Tenderness: There is abdominal tenderness. There is no guarding.      Comments: Soft abdomen with very mild left lower quadrant tenderness and no guarding or rigidity   Genitourinary:     Rectum: Normal. Guaiac result negative.   Musculoskeletal:         General: No swelling, deformity or signs of injury.      Cervical back: Neck supple.      Right lower leg: No edema.      Left lower leg: No edema.   Skin:     General: Skin is warm and dry.      Capillary Refill: Capillary refill takes less than 2 seconds.   Neurological:      Mental Status: Rodney Padilla is alert and oriented to person, place, and time.   Psychiatric:         Mood and Affect: Mood normal.         Behavior: Behavior normal.         Thought Content: Thought content normal.         Judgment: Judgment normal.         Laboratory Results:  Labs Reviewed   CBC AND DIFF - Abnormal  Result Value Ref Range Status    White Blood Cells 7.1  4.5 - 11.0 K/UL Final    RBC 4.01 (*) 4.4 - 5.5 M/UL Final    Hemoglobin 12.8 (*) 13.5 - 16.5 GM/DL Final    Hematocrit 16.1 (*) 40 - 50 % Final    MCV 93.4  80 - 100 FL Final    MCH 31.8  26 - 34 PG Final    MCHC 34.1  32.0 - 36.0 G/DL Final    RDW 09.6  11 - 15 % Final    Platelet Count 246  150 - 400 K/UL Final    MPV 7.1  7 - 11 FL Final    Neutrophils 53  41 - 77 % Final Lymphocytes 31  24 - 44 % Final    Monocytes 14 (*) 4 - 12 % Final    Eosinophils 1  0 - 5 % Final    Basophils 1  0 - 2 % Final    Absolute Neutrophil Count 3.83  1.8 - 7.0 K/UL Final    Absolute Lymph Count 2.16  1.0 - 4.8 K/UL Final    Absolute Monocyte Count 0.97 (*) 0 - 0.80 K/UL Final    Absolute Eosinophil Count 0.07  0 - 0.45 K/UL Final    Absolute Basophil Count 0.05  0 - 0.20 K/UL Final    MDW (Monocyte Distribution Width) 17.6  <20.7 Final   URINALYSIS DIPSTICK REFLEX TO CULTURE - Abnormal    Color,UA YELLOW   Final    Turbidity,UA CLEAR  CLEAR-CLEAR Final    Specific Gravity-Urine 1.029  1.005 - 1.030 Final    pH,UA 5.0  5.0 - 8.0 Final    Protein,UA NEG  NEG-NEG Final    Glucose,UA NEG  NEG-NEG Final    Ketones,UA NEG  NEG-NEG Final    Bilirubin,UA NEG  NEG-NEG Final    Blood,UA NEG  NEG-NEG Final    Urobilinogen,UA INCREASED (*) NORM-NORMAL Final    Nitrite,UA NEG  NEG-NEG Final    Leukocytes,UA NEG  NEG-NEG Final    Urine Ascorbic Acid, UA POS (*) NEG-NEG Final   PROTIME INR (PT)    Protime 12.2  9.5 - 14.2 SEC Final    INR 1.1  0.8 - 1.2 Final   PTT (APTT)    APTT 33.4  24.0 - 36.5 SEC Final   COMPREHENSIVE METABOLIC PANEL    Sodium 140  045 - 147 MMOL/L Final    Potassium 3.7  3.5 - 5.1 MMOL/L Final    Chloride 104  98 - 110 MMOL/L Final    Glucose 95  70 - 100 MG/DL Final    Blood Urea Nitrogen 9  7 - 25 MG/DL Final    Creatinine 4.09  0.4 - 1.24 MG/DL Final    Calcium 8.9  8.5 - 10.6 MG/DL Final    Total Protein 7.4  6.0 - 8.0 G/DL Final    Total Bilirubin 0.4  0.3 - 1.2 MG/DL Final    Albumin 4.1  3.5 - 5.0 G/DL Final    Alk Phosphatase 77  25 - 110 U/L Final    AST (SGOT) 15  7 - 40 U/L Final    CO2 25  21 - 30 MMOL/L Final    ALT (SGPT) 15  7 - 56 U/L Final    Anion Gap 11  3 - 12 Final    eGFR >60  >60 mL/min Final   URINALYSIS MICROSCOPIC REFLEX TO  CULTURE    WBCs,UA 0-2  0 - 2 /HPF Final    RBCs,UA 2-10  0 - 3 /HPF Final    Comment,UA     Final    Value: Criteria for reflex to culture are WBC>10, Positive Nitrite, and/or >=+1   leukocytes. If quantity is not sufficient, an addendum will follow.      MucousUA 2+   Final    Squamous Epithelial Cells 0-2  0 - 5 Final    Calcium Oxalate Crystals FEW   Final   LIPASE    Lipase 24  11 - 82 U/L Final   HIV 1 & 2 AG-AB SCRN W REFLEX TO HIV CONFIRMATION    HIV 1 and 2 AG AB Screen NONREACTIVE  NR-NONREACTIVE Final   UA GREY TOP TUBE   CLEAR TOP EXTRA URINE TUBE   CHLAM/NG PCR URINE     Hemoccult  Hemoccult: (S) Negative  QC: Acceptable    Radiology Interpretation:    No orders to display         EKG:      Medical Decision Making:  Hikeem Sterman is a 31 y.o. male who presents with chief complaint as listed above. Based on the history and presentation, the list of differential diagnoses considered included, but was not limited to, postoperative inflammation causing some bleeding, less likely diverticular bleeding, coagulopathy.  Dysuria concerning for STD such as committee/gonorrhea, UTI.  We will obtain CBC, CMP, coags, lipase, urinalysis, HIV screen, chlamydia/gonorrhea test.    ED Course    ED Course as of 04/25/22 2226   Sat Apr 25, 2022   1534 Hemoglobin(!): 12.8  Baseline mild anemia [TD]   1534 Platelet Count: 246 [TD]   1534 INR: 1.1 [TD]   1534 APTT: 33.4  No evidence of coagulopathy [TD]   1534 Lipase: 24 [TD]   1624 No evidence of urinary tract infection [TD]   1724 HIV 1 and 2 AG AB Screen: NONREACTIVE [TD]      ED Course User Index  [TD] Yvonne Kendall, MD     Throughout ER stay, patient remained hemodynamically stable and well-appearing, with baseline mild anemia, no evidence of coagulopathy, and no evidence of urinary tract infection or HIV.  Chlamydia and gonorrhea PCR pending at time of discharge, and patient instructed to see results in patient portal and that Rodney Padilla will receive a phone call by a provider for further treatment instructions if positive.  Patient stable for discharge at this time and given very strict return precautions if bleeding should increase.    The findings as well as plan for discharge were discussed with the patient. The patient understands and agrees with plan. Patient was reassessed and has appropriate vital signs and symptom control for discharge. They were provided with discharge information, strict return precautions, and follow up instructions. Patient verbalized understanding. All questions were answered prior to discharge.      Complexity of Problems Addressed  Patient's active diagnoses as well as contributing pre-existing medical problems include:  Clinical Impression   Hematochezia          Additional data reviewed:    ? History was obtained from an independent historian: Spouse  ? Prior non-ED notes reviewed: Recent discharge summary and/or H&P and Clinic note  ? Independent interpretation of diagnostic tests was performed by me: Not in addition to what is mentioned above  ? Patient presentation/management was discussed with the following qualified health care professionals and/or other relevant professionals: Not  in addition to what is mentioned above    Risk evaluation:    ? Diagnosis or treatment of patient condition impacted by social determinant of health: None  ? Tests Considered but not performed due to clinical scoring (if not mentioned in ED course, aside from what is implied by clinical scores listed): n/a  ? Rationale regarding whether admission or escalation of care considered if not performed (if not mentioned in ED course, aside from what is implied by clinical scores listed): n/a    ED Scoring:                                Facility Administered Meds:  Medications - No data to display    Clinical Impression:  Clinical Impression   Hematochezia       Disposition/Follow up  ED Disposition     ED Disposition   Discharge           [facility] [place]  [address]  [id number] [place] [address]  [phone]            Medications:  Discharge Medication List as of 04/25/2022  5:26 PM          Procedure Notes:  Procedures     Shelba Flake, MD  Emergency Medicine, PGY-2  Reachable by [facility]  04/25/2022    Attestation / Supervision:

## 2022-04-25 NOTE — ED Notes
Report to [place], RN.

## 2022-04-25 NOTE — ED Notes
Pt alert and oriented x4 with no new complaint. Pt provided discharge instruction. Pt verbalize understanding and denies further question or concerns at this time.

## 2022-05-18 ENCOUNTER — Encounter: Admit: 2022-05-18 | Discharge: 2022-05-18 | Payer: BC Managed Care – PPO

## 2022-05-18 NOTE — Telephone Encounter
Pt left VM requesting a return call to discuss symptoms such as abd pain, pain while urinating, bloody stool    1340: RN spoke with patient who states starting this AM, he had a very small bowel movement that was about 40% blood, intermittent abd cramping about a 3/10, emesis x 1 w/o blood, pain while urinating, dizziness when going from sitting to standing. Denies fever and states his abd is soft to the touch. RN to speak with provider to discuss.    1345: RN spoke with N. Fisher, APRN to discuss above symptoms. N. Fisher, APRN states that patient will need to decide if his symptoms warrant a trip to [facility] or ER at this time based on how he is feeling. Patient can continue to monitor symptoms at this time but if he feels like he needs to be assessed, he needs to go to his local UC or ER. Instructed pt  to go to the ER immediately if having large amount bloody stools, dizziness increasing/feeling like he is going to pass out, firm abd, fever, N/V -especially if containing blood.     1350: Spoke with patient about provider recommendations. Patient states understanding

## 2022-05-26 ENCOUNTER — Encounter: Admit: 2022-05-26 | Discharge: 2022-05-26 | Payer: BC Managed Care – PPO

## 2022-05-26 NOTE — Telephone Encounter
New Patient Appointment with Colorectal Surgery (CRS)  Date: Tues (06/23/22)  Time: 10am  CRS Provider Name (full name): Dr. Eulah Pont    Clinic Location [facility] w/address): [employer], [address] [place], [address], [place], [place]  [address]    Verbal directions to appointment given on (date): 8.15.23    Patient has MyChart Access (yes, no w/reason or date instructions sent to pt): yes      Other Appt/Tests Coordinated with this Appt (n/a or test name w/date & time): 8.30.23 colonoscopy at [facility]     Need Hoyer Lift (yes or n/a. For disabled patients.): n/a    Engineer, technical sales (n/a, Language or Language & ID # if used for this appt): n/a    Patient expressed understanding of below:   -Check-in 15-30 min early, if possible (yes/no): yes   -Wait List (yes, n/a or pt declined): n/a - 8.30.23 colonoscopy at [facility] - results needed.   -No-Show/Late Cancellation (NS/LC) Policy (yes, or no w/reason): yes  The surgeons ask that we let all patients know of the NS/LC Policy here at [facility] for all appointments: If you need to cancel/reschedule, please try and let us know as far in advance as possible. If you cancel after 12PM the day before the appointment, it is considered a NS/LC. When you accumulate NS/LC, it gives the provider the option to review and possibly decline future appointments.     -Insurance Referral Authorization (Tricare Prime, [place] CCN, [employer] (umbrella auth/referral from PCP only), [employer] (Health Dept Approval), [employer], [employer]HC Navigate (umbrella auth/referral from PCP only) [place] [employer] (with name of [place]) or Self-Pay (n/a, Self-Pay or Auth # & date range):     SELF PAY: [employer] expires 8.31.23. patient lost his job. I gave patient the [facility] Financial Customer Service Center # at [phone].    CRS Provider Preference (any or name of provider(s)): any    Referring Provider (name & specialty): Dr. Estella Husk (ACS)  Care Team Updated on (date): 8.15.23    Diagnosis:  diverticulitis       Patient understands PCP/Referring Provider to follow and assist with managing symptoms until seen by a specialist, and advocate as needed (yes/no):  yes    Patient understands the benefits of establishing care with a specialist and once established, we follow for three(3) years and renews after each visit (yes/no): yes     Verbal records history from (patient, name of family member, caregiver, etc.): patient    Continuation of Care    GI Lab Testing/Procedure Reports with any Associated Pathology (FROM LAST 10 YEARS):   Colonoscopy, EGD, Flexible Sigmoidoscopy, Anorectal Manometry, Capsule Endoscopy, Endoscopic Ultrasound   Patient GI Testing History (Approx year, name of test, facility and/or provider name as needed. Or document, Pt with no memory of provider/facility name.):     8.30.23 colonoscopy at [facility]    Operative Report, Surgical Pathology & Discharge Summary (FROM LIFETIME): Abdominal & Pelvic Surgeries, Colon, Rectal & Anal Surgeries   Patient Abdominal/Pelvic Surgery History (Approx year, non-clinical name of surgery, facility and/or provider name as needed. Or document, Pt with no memory of provider/facility name.):     approx 5 yrs ago - gallbladder removal by Dr. Yancey Flemings (GEN SURG) at [facility]    Radiology Imaging Reports & Cloud Images (FROM LAST 5 YEARS):    CT c/a/p, MRI a/p, MRE, Xray: Abdomen (KUB), Pelvis, Acute Abdominal Series, Small Bowel Follow Thru, PET Scan: Full Body or Skull to  Thighs. Colon Single Contrast &/or MRI Defecography, Barium/Gastrographin Enemas, Sitz Marker Study  Patient Radiology History (List name of Hospital(s)/Facilities(s) only):     [place]  [facility]    Pelvic Floor Physical Therapy Progress Notes or Biofeedback Testing/Pelvic EMG Testing (FROM LAST 5 YEARS).  Patient Physical Therapy History (n/a or list timeframe w/facility name. Common diagnoses w/PT: constipation, fecal incontinence, pelvic floor dysfunction, rectocele & rectal prolapse): n/a    Gastroenterology Office Visit Notes/Progress Notes (FROM LAST 5 YEARS).  Patient Office Visit Notes/Progress Notes History: CNC/RN to review and request as needed.     Emailed Records/Referral from Physician Consult to [email or web address] (scan into O2) and corresponding CNC/RN (n/a or RN name w/date): n/a

## 2022-06-10 ENCOUNTER — Encounter: Admit: 2022-06-10 | Discharge: 2022-06-10 | Payer: BC Managed Care – PPO

## 2022-06-10 ENCOUNTER — Ambulatory Visit: Admit: 2022-06-10 | Discharge: 2022-06-10 | Payer: BC Managed Care – PPO

## 2022-06-10 DIAGNOSIS — N390 Urinary tract infection, site not specified: Secondary | ICD-10-CM

## 2022-06-10 DIAGNOSIS — F32A Depression: Secondary | ICD-10-CM

## 2022-06-10 MED ORDER — LIDOCAINE (PF) 20 MG/ML (2 %) IJ SOLN
INTRAVENOUS | 0 refills | Status: DC
Start: 2022-06-10 — End: 2022-06-10
  Administered 2022-06-10: 20:00:00 60 mg via INTRAVENOUS

## 2022-06-10 MED ORDER — PROPOFOL 10 MG/ML IV EMUL 50 ML (INFUSION)(AM)(OR)
INTRAVENOUS | 0 refills | Status: DC
Start: 2022-06-10 — End: 2022-06-10
  Administered 2022-06-10: 20:00:00 200 ug/kg/min via INTRAVENOUS

## 2022-06-10 MED ORDER — PROPOFOL INJ 10 MG/ML IV VIAL
INTRAVENOUS | 0 refills | Status: DC
Start: 2022-06-10 — End: 2022-06-10
  Administered 2022-06-10: 20:00:00 70 mg via INTRAVENOUS
  Administered 2022-06-10 (×2): 30 mg via INTRAVENOUS

## 2022-06-10 MED ADMIN — STERILE WATER/SIMETHICONE IRRIGATION [210913]: 60 mL | @ 20:00:00 | Stop: 2022-06-10 | NDC 54029137209

## 2022-06-10 MED ADMIN — LACTATED RINGERS IV SOLP [4318]: 1000.000 mL | INTRAVENOUS | @ 19:00:00 | Stop: 2022-06-10 | NDC 00338011704

## 2022-06-10 NOTE — Anesthesia Post-Procedure Evaluation
Post-Anesthesia Evaluation    Name: Rodney Padilla      MRN: [id number]     DOB: Jan 11, 1991     Age: 31 y.o.     Sex: male   __________________________________________________________________________     Procedure Information     Anesthesia Start Date/Time: 06/10/22 1443    Procedures:       COLONOSCOPY DIAGNOSTIC WITH SPECIMEN COLLECTION BY BRUSHING/ WASHING - FLEXIBLE      COLONOSCOPY VIA STOMA WITH BIOPSY    Location: ENDO 6 (IR) / ENDO/GI    Surgeons: Rayfield Citizen, MD          Post-Anesthesia Vitals      Vitals Value Taken Time   BP 101/79 06/10/22 1532   Temp 36.4 C (97.6 F) 06/10/22 1519   Pulse 50 06/10/22 1532   Respirations 10 PER MINUTE 06/10/22 1532   SpO2 100 % 06/10/22 1532   O2 Device     ABP     ART BP           Post Anesthesia Evaluation Note    Evaluation location: Pre/Post  Patient participation: recovered; patient participated in evaluation  Level of consciousness: alert    Pain score: 0  Pain management: adequate    Hydration: normovolemia  Temperature: 36.0C - 38.4C  Airway patency: adequate    Perioperative Events       Post-op nausea and vomiting: no PONV    Postoperative Status  Cardiovascular status: hemodynamically stable  Respiratory status: spontaneous ventilation  Follow-up needed: none        Perioperative Events  There were no known notable events for this encounter.

## 2022-06-11 ENCOUNTER — Encounter: Admit: 2022-06-11 | Discharge: 2022-06-11 | Payer: BC Managed Care – PPO

## 2022-06-11 DIAGNOSIS — F32A Depression: Secondary | ICD-10-CM

## 2022-06-11 DIAGNOSIS — N390 Urinary tract infection, site not specified: Secondary | ICD-10-CM

## 2022-06-23 ENCOUNTER — Encounter: Admit: 2022-06-23 | Discharge: 2022-06-23

## 2023-04-05 ENCOUNTER — Encounter: Admit: 2023-04-05 | Discharge: 2023-04-05

## 2023-04-05 ENCOUNTER — Ambulatory Visit: Admit: 2023-04-05 | Discharge: 2023-04-06

## 2023-04-22 ENCOUNTER — Encounter: Admit: 2023-04-22 | Discharge: 2023-04-22

## 2023-04-22 NOTE — Telephone Encounter
An expanded screening panel for genetic disorders was performed on Rodney Padilla .  Below are listed results for the most common genetic disorders recommended by ACOG and ACMG and conditions for which each tested POSITIVE.  Results of the remaining disorders may be reviewed on the laboratory report.    The results reflect that Rodney Padilla is of Caucasian ancestry.    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 The disorder's most common signs and symptoms include progressive damage to the respiratory system and chronic digestive system problems.  The abnormal mucus can lead to recurrent pneumonias, bacterial infections and lung damage.  In addition, affected individuals often have delayed growth and may have pancreatic insufficiency related to their condition.   New treatments are based on specific mutations and have significantly improved outcomes for eligible patients.  The mutation that the patient carries would be eligible for these treatments.   Although the treatments for cystic fibrosis have improved, it is still considered a serious disorder.   The features of the disorder and their severity vary among affected individuals.    The results reflect that Rodney Padilla is of Hispanic ancestry.    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 It is caused by a loss of motor neurons in the spinal cord and the brainstem. The loss of motor neurons leads to weakness and atrophy of muscles used for activities such as crawling, walking, sitting up, and controlling head movement. In severe cases of spinal muscular atrophy, the muscles used for breathing and swallowing are affected. There are several types of spinal muscular atrophy distinguished by the pattern of features, severity of muscle weakness, and age when the muscle problems begin.  Until very recently, this condition was considered lethal with only supportive therapies available.  Recently, a gene therapy has become available as a treatment for this condition.  However, to achieve the best results, therapy should begin before or at the earliest signs of the condition.    Some individuals who are carriers for this condition have two copies of the SMN1 gene which are in cis, or on one chromosome together.  This is difficult to distinguish by simply counting the number of SMN1 genes an individual has.  In order to detect this condition, some laboratories assess the presence of a SNP (g.27134T>G).  The presence of this SNP confers an increased risk to be a 2+0 carrier, but it cannot absolutely determine the status of the individual    The couple was reminded that carrier screening may reduce but not eliminate the risk for the tested conditions.  In addition, this is not an exhaustive test for genetic disorders.  These results were communicated to Rodney Padilla and Rodney Padilla.

## 2023-04-30 ENCOUNTER — Encounter: Admit: 2023-04-30 | Discharge: 2023-04-30

## 2023-04-30 DIAGNOSIS — Z1379 Encounter for other screening for genetic and chromosomal anomalies: Secondary | ICD-10-CM

## 2024-06-13 ENCOUNTER — Encounter: Admit: 2024-06-13 | Discharge: 2024-06-13 | Payer: PRIVATE HEALTH INSURANCE

## 2024-06-13 ENCOUNTER — Emergency Department: Admit: 2024-06-13 | Discharge: 2024-06-13 | Payer: PRIVATE HEALTH INSURANCE

## 2024-06-13 ENCOUNTER — Emergency Department
Admit: 2024-06-13 | Discharge: 2024-06-13 | Disposition: A | Discharge status: Home / Self Care | Payer: PRIVATE HEALTH INSURANCE

## 2024-06-15 ENCOUNTER — Encounter: Admit: 2024-06-15 | Discharge: 2024-06-15 | Payer: PRIVATE HEALTH INSURANCE

## 2024-06-15 ENCOUNTER — Emergency Department
Admit: 2024-06-15 | Discharge: 2024-06-15 | Disposition: A | Discharge status: Home / Self Care | Payer: PRIVATE HEALTH INSURANCE

## 2024-09-04 ENCOUNTER — Encounter: Admit: 2024-09-04 | Discharge: 2024-09-04 | Payer: PRIVATE HEALTH INSURANCE

## 2024-10-20 ENCOUNTER — Encounter: Admit: 2024-10-20 | Discharge: 2024-10-20 | Payer: PRIVATE HEALTH INSURANCE
